# Patient Record
Sex: Female | Born: 1965 | Race: Black or African American | Hispanic: No | Marital: Married | Smoking: Never smoker
Health system: Southern US, Community
[De-identification: ages and names within clinical notes are randomized; demographics above are authoritative.]

## PROBLEM LIST (undated history)

## (undated) DIAGNOSIS — I10 Essential (primary) hypertension: Secondary | ICD-10-CM

## (undated) DIAGNOSIS — E119 Type 2 diabetes mellitus without complications: Secondary | ICD-10-CM

---

## 2009-01-11 ENCOUNTER — Ambulatory Visit: Payer: Self-pay | Admitting: Radiology

## 2009-01-11 ENCOUNTER — Emergency Department (HOSPITAL_BASED_OUTPATIENT_CLINIC_OR_DEPARTMENT_OTHER): Admission: EM | Admit: 2009-01-11 | Discharge: 2009-01-11 | Payer: Self-pay | Admitting: Emergency Medicine

## 2010-02-28 ENCOUNTER — Inpatient Hospital Stay (HOSPITAL_COMMUNITY): Admission: AD | Admit: 2010-02-28 | Discharge: 2010-02-28 | Payer: Self-pay | Admitting: Obstetrics & Gynecology

## 2010-02-28 ENCOUNTER — Ambulatory Visit: Payer: Self-pay | Admitting: Gynecology

## 2010-03-02 ENCOUNTER — Ambulatory Visit (HOSPITAL_COMMUNITY): Admission: RE | Admit: 2010-03-02 | Discharge: 2010-03-02 | Payer: Self-pay | Admitting: Obstetrics & Gynecology

## 2010-03-02 ENCOUNTER — Ambulatory Visit: Payer: Self-pay | Admitting: Obstetrics and Gynecology

## 2010-03-12 ENCOUNTER — Ambulatory Visit (HOSPITAL_COMMUNITY): Admission: RE | Admit: 2010-03-12 | Discharge: 2010-03-12 | Payer: Self-pay | Admitting: Obstetrics & Gynecology

## 2010-03-12 ENCOUNTER — Ambulatory Visit: Payer: Self-pay | Admitting: Obstetrics & Gynecology

## 2010-04-15 ENCOUNTER — Other Ambulatory Visit: Admission: RE | Admit: 2010-04-15 | Discharge: 2010-04-15 | Payer: Self-pay | Admitting: Obstetrics & Gynecology

## 2010-04-15 ENCOUNTER — Ambulatory Visit: Payer: Self-pay | Admitting: Obstetrics & Gynecology

## 2010-04-16 ENCOUNTER — Encounter: Payer: Self-pay | Admitting: Obstetrics & Gynecology

## 2010-04-16 LAB — CONVERTED CEMR LAB

## 2010-12-09 LAB — URINE CULTURE: Colony Count: >=100000

## 2010-12-09 LAB — URINALYSIS, ROUTINE W REFLEX MICROSCOPIC
Bilirubin Urine: NEGATIVE
Glucose, UA: NEGATIVE mg/dL
Hgb urine dipstick: NEGATIVE
Ketones, ur: NEGATIVE mg/dL
Leukocytes, UA: NEGATIVE
Nitrite: POSITIVE — AB
Specific Gravity, Urine: >1.03 — ABNORMAL HIGH (ref 1.005–1.030)
pH: 6 (ref 5.0–8.0)

## 2010-12-09 LAB — POCT PREGNANCY, URINE: Preg Test, Ur: POSITIVE

## 2010-12-09 LAB — CBC
MCV: 90.4 fL (ref 78.0–100.0)
RBC: 3.52 MIL/uL — ABNORMAL LOW (ref 3.87–5.11)
WBC: 8.2 10*3/uL (ref 4.0–10.5)

## 2010-12-09 LAB — URINE MICROSCOPIC-ADD ON

## 2010-12-09 LAB — WET PREP, GENITAL
Trich, Wet Prep: NONE SEEN
Yeast Wet Prep HPF POC: NONE SEEN

## 2010-12-09 LAB — HCG, QUANTITATIVE, PREGNANCY
hCG, Beta Chain, Quant, S: 85928 m[IU]/mL — ABNORMAL HIGH (ref <5)
hCG, Beta Chain, Quant, S: 87647 m[IU]/mL — ABNORMAL HIGH (ref <5)

## 2011-01-01 LAB — CBC
HCT: 32.7 % — ABNORMAL LOW (ref 36.0–46.0)
MCHC: 33.4 g/dL (ref 30.0–36.0)
MCHC: 34.7 g/dL (ref 30.0–36.0)
MCV: 88.9 fL (ref 78.0–100.0)
Platelets: 307 10*3/uL (ref 150–400)
RDW: 13.5 % (ref 11.5–15.5)
RDW: 13.8 % (ref 11.5–15.5)

## 2011-01-01 LAB — DIFFERENTIAL
Basophils Absolute: 0 10*3/uL (ref 0.0–0.1)
Basophils Absolute: 0 10*3/uL (ref 0.0–0.1)
Basophils Relative: 0 % (ref 0–1)
Eosinophils Absolute: 0.1 10*3/uL (ref 0.0–0.7)
Eosinophils Absolute: 0.1 10*3/uL (ref 0.0–0.7)
Eosinophils Relative: 2 % (ref 0–5)
Lymphocytes Relative: 25 % (ref 12–46)
Lymphocytes Relative: 32 % (ref 12–46)
Lymphs Abs: 2.1 10*3/uL (ref 0.7–4.0)
Monocytes Absolute: 0.4 10*3/uL (ref 0.1–1.0)

## 2011-02-04 NOTE — Assessment & Plan Note (Signed)
NAMEADRIONA, Sherry Hines                ACCOUNT NO.:  [id number]   MEDICAL RECORD NO.:  [id number]          PATIENT TYPE:  POB   LOCATION:  [facility] at [facility]         FACILITY:  [facility]   PHYSICIAN:  Sherry Collins, MD     DATE OF BIRTH:  [birth year]   DATE OF SERVICE:  04/15/2010                                  CLINIC NOTE   REASON FOR VISIT:  Postoperative appointment, physical examination.   HISTORY OF PRESENT ILLNESS:  The patient is a 45 year old gravida 6,  para 4-0-2-4 status post dilation and evacuation for blighted ovum on  March 12, 2010 who is here for a postoperative visit and her physical  examination to Pathology.  After that procedure, it has come back  positive for chorionic villi and products of conception.  Since the  procedure, the patient says she has not had any menstrual period, but  she does say that over the last 2 days, she has had some vulvar  irritation.  She has not noted any abnormal discharge or any other  gynecologic concerns.  The patient has resumed sexual activity without  any problems apart from the vulvar irritation.  The patient does not  have a primary gynecologist.  Her last Pap smear and mammogram were both  in 2009 and were normal.   PAST OBSTETRIC AND GYNECOLOGIC HISTORY:  The patient is a gravida 6,  para 4-0-2-4.  She has had 4 vaginal deliveries and 2 miscarriages, the  last one being recently.  She is on no birth control at this point.  She  had menarche at age 89.  She has regular menstrual periods 28 days in  between periods and appears lasts for 6 days.  She does report having  moderate dysmenorrhea and no intermenstrual bleeding.  Her last  menstrual period was December 31, 2009, which was prior to her blighted  ovum.  The patient's last Pap smear was 2 years ago.  She does have a  history of abnormal Pap smear in 1994 and had normal repeat Pap.  She  denies any other gynecologic disorders or sexually transmitted  infections.   PAST MEDICAL  HISTORY:  None.   PAST SURGICAL HISTORY:  Dilation and evacuation.   MEDICATIONS:  None.   ALLERGIES:  No known drug allergies.   SOCIAL HISTORY:  The patient works as a Licensed conveyancer.  She lives with  her husband and fourth daughter.  She denies any alcohol, tobacco, or  illicit drug use.  She denies any past or current history of sexual or  physical abuse.   FAMILY HISTORY:  Only remarkable for an extensive history of diabetes  and high blood pressure.  Mother also has a history of deep venous  thrombosis.  She denies any family history of cancer of the breast,  ovaries, or uterus.   REVIEW OF SYSTEMS:  Remarkable for headache, lightheadedness, and  abdominal pain and vulvar irritation which causes pain with intercourse.   PHYSICAL EXAMINATION:  VITAL SIGNS:  Blood pressure 125/81, pulse rate  is 71, weight 251 pounds, height 5 feet and 6 inches.  GENERAL:  No apparent distress.  HEENT:  Normocephalic, atraumatic.  NECK:  Normal thyroid.  No masses.  BREASTS:  Symmetric in size, soft, pendulous.  No abnormal masses, skin  changes, nipple drainage, or lymphadenopathy noted.  LUNGS:  Clear to  auscultation bilaterally.  HEART:  Regular rate and rhythm.  ABDOMEN:  Soft obese, nontender.  No organomegaly.  EXTREMITIES:  No cyanosis, clubbing, or edema.  PELVIC:  Normal external female genitalia.  Pink well rugated vagina.  No erythema or lesions seen on the introitus or in the vagina, some thin  white discharge was noted and the sample was taken for wet prep.  She  has normal cervical contour which is parous and does have a prominent  ectropion.  Pap smear was obtained.  On bimanual exam, the patient has  small mobile uterus.  No adnexal masses palpated secondary to patient's  habitus.   ASSESSMENT AND PLAN:  The patient is a 46 year old gravida 6, para 4-0-2-  4 for who is here for postoperative visit and for physical examination.  The patient just has post procedure  amenorrhea.  She was told that this  is not unusual and that no evaluation is necessary, unless she goes 3  months after the procedure without any period at which point workup in  the form of labs or ultrasound may be needed.  The patient is interested  in going on birth control.  She was told that this could make her  amenorrhea continue, but she was okay with this.  She has been on Yasmin  in the past and wanted prescription for this which was given to her.  The patient will also be scheduled for mammogram at the end of this  visit.  We will also follow up on her Pap smear results.  The patient  does have a normal examination after her D and E.  She was told that if  she did want to conceive again that she would have to be on prenatal  vitamins and other preconceptual counseling was given to the patient.  She says that she is not trying any time soon and will remain on Yaz for  now.  The patient was told to call or come back in for any further  gynecologic symptoms.           ______________________________  Sherry Collins, MD     UA/MEDQ  D:  04/15/2010  T:  04/16/2010  Job:  [id number]

## 2012-01-19 ENCOUNTER — Other Ambulatory Visit: Payer: Self-pay | Admitting: Obstetrics and Gynecology

## 2012-01-19 ENCOUNTER — Other Ambulatory Visit (HOSPITAL_COMMUNITY)
Admission: RE | Admit: 2012-01-19 | Discharge: 2012-01-19 | Disposition: A | Discharge status: Home / Self Care | Payer: Managed Care, Other (non HMO) | Source: Ambulatory Visit | Attending: Obstetrics and Gynecology | Admitting: Obstetrics and Gynecology

## 2012-01-19 DIAGNOSIS — Z124 Encounter for screening for malignant neoplasm of cervix: Secondary | ICD-10-CM | POA: Insufficient documentation

## 2012-01-26 ENCOUNTER — Other Ambulatory Visit: Payer: Self-pay | Admitting: Obstetrics and Gynecology

## 2012-09-28 ENCOUNTER — Other Ambulatory Visit: Payer: Self-pay

## 2012-10-27 ENCOUNTER — Encounter (HOSPITAL_COMMUNITY): Payer: Self-pay

## 2012-10-27 ENCOUNTER — Ambulatory Visit (HOSPITAL_COMMUNITY)
Admission: RE | Admit: 2012-10-27 | Discharge: 2012-10-27 | Disposition: A | Discharge status: Home / Self Care | Payer: Managed Care, Other (non HMO) | Source: Ambulatory Visit | Attending: Obstetrics and Gynecology | Admitting: Obstetrics and Gynecology

## 2012-10-27 ENCOUNTER — Other Ambulatory Visit: Payer: Self-pay | Admitting: Obstetrics and Gynecology

## 2012-10-27 VITALS — BP 136/76 | HR 60 | Wt 261.0 lb

## 2012-10-27 DIAGNOSIS — O289 Unspecified abnormal findings on antenatal screening of mother: Secondary | ICD-10-CM | POA: Insufficient documentation

## 2012-10-27 DIAGNOSIS — O358XX0 Maternal care for other (suspected) fetal abnormality and damage, not applicable or unspecified: Secondary | ICD-10-CM

## 2012-10-27 DIAGNOSIS — O09529 Supervision of elderly multigravida, unspecified trimester: Secondary | ICD-10-CM

## 2012-10-27 DIAGNOSIS — Z1389 Encounter for screening for other disorder: Secondary | ICD-10-CM | POA: Insufficient documentation

## 2012-10-27 DIAGNOSIS — O352XX0 Maternal care for (suspected) hereditary disease in fetus, not applicable or unspecified: Secondary | ICD-10-CM | POA: Insufficient documentation

## 2012-10-27 DIAGNOSIS — Z363 Encounter for antenatal screening for malformations: Secondary | ICD-10-CM | POA: Insufficient documentation

## 2012-10-27 NOTE — Progress Notes (Signed)
Sherry Hines  was seen today for an ultrasound appointment.  See full report in [facility].  Comments: [name] was seen today due to advanced maternal age and quad screen that quoted a 1:6 DSR as well as a 1:10 Trisomy 18 risk.  Additionally, she reports a previous pregnancy that was complicated by multiple anomalies and lethal fetal heart defect- formal karyotype was not performed with that gestation.  Ultrasound today was limited due to early gestational age.  There appears to be a midline cleft lip.  No other abnormalities or soft markers were identified, but limited views of the heart and face were obtained.  See separate note from Genetics counseling.  Findings and limitations of the ultrasound were discussed.  We briefly reviewed the etiology of cleft lip +/- palate to include isolated as well as syndromic/ genetic etiologies.  After counseling, the patient elected to undergo NIPT (cell free fetal DNA).  The patient will return in 2 weeks to reevaluate the fetal anatomy and to review test results.  Will schedule fetal echo at that time based on ultrasound finding at her next follow up.  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 Based on ultrasound findings at that time - will scheduled fetal echo at that time Alpha Gula, MD

## 2012-10-27 NOTE — Progress Notes (Signed)
Genetic Counseling  High-Risk Gestation Note  Appointment Date:  10/27/2012 Referred By: Fortino Sic, MD Date of Birth:  Apr 02, 1966 Partner:  Minerva Areola   Pregnancy History: [id number] Estimated Date of Delivery: 04/11/13 Estimated Gestational Age: [redacted]w[redacted]d Attending: Alpha Gula, MD  Mrs. Kern Alberta and her husband, Mr. Novelle Addair, were seen for genetic counseling regarding a maternal age of 47 y.o. and an abnormal First trimester screening result.    They were counseled regarding maternal age and the association with risk for chromosome conditions due to nondisjunction with aging of the ova.   We reviewed chromosomes, nondisjunction, and the associated 1 in 16 risk for fetal aneuploidy related to a maternal age of 47 y.o. at [redacted]w[redacted]d gestation.  They were counseled that the risk for aneuploidy decreases as gestational age increases, accounting for those pregnancies which spontaneously abort.  We specifically discussed Down syndrome (trisomy 48), trisomies 66 and 62, and sex chromosome aneuploidies (47,XXX and 47,XXY) including the common features and prognoses of each.   We also reviewed Ms. Vajda's First Trimester screening result and the associated increase in risks for fetal Down syndrome (1 in 26 to 1 in 6) and fetal trisomy 18 (1 in 50 to 1 in 10).  They were counseled regarding other explanations for a screen positive result including normal variation and differences in maternal metabolism. They understand that First trimester screening provides a pregnancy specific risk for aneuploidy, but is not considered to be diagnostic.    We reviewed other available screening options including noninvasive prenatal testing (NIPT) and detailed ultrasound.  Specifically, we discussed that NIPT analyzes cell free fetal DNA found in the maternal circulation. This test is not diagnostic for chromosome conditions, but can provide information regarding the presence or absence of extra fetal DNA for chromosomes 13,  18, 21, X, and Y, and missing fetal DNA for chromosome X (Turner syndrome) and Y. Thus, it would not identify or rule out all genetic conditions. The reported detection rate is greater than 99% for Trisomy 21, greater than 98% for Trisomy 18, and is approximately 80% (8 out of 10) for Trisomy 13. The false positive rate is reported to be less than 0.1% for any of these conditions.  In addition, we discussed that ~50-80% of fetuses with Down syndrome and up to 90-95% of fetuses with trisomy 18/13, when well visualized, have detectable anomalies or soft markers by detailed ultrasound (~18+ weeks gestation).   This couple was also counseled regarding diagnostic testing via amniocentesis.  We reviewed the approximate 1 in 300-500 risk for complications, including spontaneous pregnancy loss. After consideration of all the options, they expressed interest in having a detailed ultrasound.  A complete ultrasound was performed today.  The ultrasound report will be documented separately. Ultrasound views were limited today, given the early gestational age.  However, some views were concerning for a fetal cleft lip.  Follow up ultrasound was scheduled in ~2 weeks.  They were counseled that the presence of a fetal cleft lip increases the likelihood of a fetal aneuploidy.  We again reviewed the options of NIPT versus amniocentesis.  They elected to have NIPT ([facility]) today. Results should be available in ~8-10 business days.  Further counseling is warranted pending follow up ultrasound and review of the NIPT results.  Mrs. Cockrum was provided with written information regarding sickle cell anemia (SCA) including the carrier frequency and incidence in the African-American population, the availability of carrier testing and prenatal diagnosis if indicated.  In addition, we  discussed that hemoglobinopathies are routinely screened for as part of the [place] newborn screening panel.  We reviewed Mrs. Pennebaker's normal hemoglobin  electrophoresis results.  Both family histories were reviewed and found to be contributory.  This couple reported that they had a prior pregnancy with multiple anomalies.  They report that the screening for aneuploidy during this pregnancy was positive for trisomy 18.  Confirmatory testing via amniocentesis was not performed.  This couple elected to end the pregnancy based on the severity of the observed anomalies.  We discussed that if the fetus had trisomy 89, or another trisomy, the risk of recurrence for the current and future pregnancies would be increased above the age related risk.     In addition, Mr. Mian reported a very strong family history of cancer.  Unfortunately, he was unable to recall specific ages of diagnoses and types of cancer for his relatives.  We reviewed that although most cancers are thought to be sporadic or due to environmental factors, some families appear to have a strong predisposition to cancers.  When considering a family history of cancer, we look for common types of cancer in multiple family members occurring at younger than typical ages.  We discussed the option of meeting with a cancer genetic counselor to discuss any possible screening or testing options available. If they are concerned about the family history of cancer and would like to learn more about the family's chance for an inherited cancer syndrome, his doctor may refer him or his relatives to [facility] [phone]). The remainder of the family history was noncontributory for birth defects, mental retardation, and known genetic conditions. Without further information regarding the provided family history, an accurate genetic risk cannot be calculated. Further genetic counseling is warranted if more information is obtained.  Mrs. Hemsley denied exposure to environmental toxins or chemical agents. She denied the use of alcohol, tobacco or street drugs. She denied significant viral illnesses  during the course of her pregnancy.     I counseled this couple regarding the above risks and available options.  The approximate face-to-face time with the genetic counselor was 68 minutes.    Donald Prose, MS  Certified Genetic Counselor

## 2012-11-10 ENCOUNTER — Telehealth (HOSPITAL_COMMUNITY): Payer: Self-pay

## 2012-11-10 ENCOUNTER — Encounter (HOSPITAL_COMMUNITY): Payer: Self-pay

## 2012-11-10 ENCOUNTER — Ambulatory Visit (HOSPITAL_COMMUNITY)
Admission: RE | Admit: 2012-11-10 | Discharge: 2012-11-10 | Disposition: A | Discharge status: Home / Self Care | Payer: Managed Care, Other (non HMO) | Source: Ambulatory Visit | Attending: Obstetrics and Gynecology | Admitting: Obstetrics and Gynecology

## 2012-11-10 VITALS — BP 130/68 | HR 79 | Wt 264.5 lb

## 2012-11-10 DIAGNOSIS — O09529 Supervision of elderly multigravida, unspecified trimester: Secondary | ICD-10-CM | POA: Insufficient documentation

## 2012-11-10 DIAGNOSIS — Z3689 Encounter for other specified antenatal screening: Secondary | ICD-10-CM | POA: Insufficient documentation

## 2012-11-10 DIAGNOSIS — O289 Unspecified abnormal findings on antenatal screening of mother: Secondary | ICD-10-CM | POA: Insufficient documentation

## 2012-11-10 DIAGNOSIS — O358XX0 Maternal care for other (suspected) fetal abnormality and damage, not applicable or unspecified: Secondary | ICD-10-CM | POA: Insufficient documentation

## 2012-11-10 DIAGNOSIS — O352XX0 Maternal care for (suspected) hereditary disease in fetus, not applicable or unspecified: Secondary | ICD-10-CM | POA: Insufficient documentation

## 2012-11-10 NOTE — Telephone Encounter (Signed)
Called Kern Alberta to discuss her Harmony, cell free fetal DNA testing. Testing was offered because of a maternal age of 44 and an abnormal first trimester screening result.  Patient was identified by her name and DOB. We reviewed that these are within normal limits, showing a less than 1 in 10,000 risk for trisomies 21, 18 and 13. We reviewed that this testing identifies > 99% of pregnancies with trisomy 21, >98% of pregnancies with trisomy 8, and >80% with trisomy 4; the false positive rate is <0.1% for all conditions. Testing was also performed for X and Y chromosome analysis. This did not show evidence of aneuploidy for X or Y. It was also consistent with female gender. X and Y analysis has a detection rate of approximately 99%. She understands that this testing does not identify all genetic conditions. All questions were answered to her satisfaction, she was encouraged to call with additional questions or concerns.  Despina Arias, MS Certified Genetic Counselor

## 2012-11-10 NOTE — Progress Notes (Signed)
Sherry Hines  was seen today for an ultrasound appointment.  See full report in [facility].  Comments: Sherry Hines returns today for follow up.  She was initially seen due to advanced maternal age and quad screen that quoted a 1:6 DSR as well as a 1:10 Trisomy 18 risk.  Additionally, she reports a previous pregnancy that was complicated by multiple anomalies and lethal fetal heart defect- formal karyotype was not performed with that gestation.  NIPT (Cell free fetal DNA) returned low risk for fetal aneuploidy.  Somewhat limited views of Sherry fetal anatomy were obtained today due to fetal position and maternal body habitus.  There appears to be a right-sided cleft lip and palate.  On some views of Sherry profile, there appears to be extra tissue along Sherry lower portion of Sherry nose - but it is difficult to determine whether this is due to Sherry cleft lip on another facial abnormality.  Echogenic foci are noted in both ventricles.  Sherry fetal heart was suboptimally visualized, but there appears to be a Ventricular septal defect.  Sherry ultrasound findings and limitations were discussed with Sherry Hines.  Although NIPT is reassuring, Sherry facial defects that were appreciated may more closely resemble Trisomy 13 than Trisomy 18/21.  Sherry detection rate for Trisomy 13 by cell free fetal DNA is not as at Sherry level that is frequently quoted for Trisomy 21 and 18.  We discussed amniocentesis - will try to expedite a fetal echo with Peds cardiology. If Sherry fetal cardiac abnormalities are confirmed on fetal echo, feel that Sherry Hines will elect to undergo further testing (amniocentesis).   Impression: Single IUP at 18 2/7 weeks Right sided cleft lip/ palate ? facial abnormality on lower portion of Sherry nose Echogenic foci in both ventricles of Sherry heart.  Possible VSD although views somewhat limited. Normal amniotic fluid volume  Recommendations: Fetal echo tentatively scheduled tomorrow - although visualization may be  difficult at that time Hines will contact our office by Sherry end of Sherry week if Sherry Hines choosed to undergo amniocentesis Otherwise follow up ultrasound in 3-4 weeks for growth and to reevaluate fetal anatomy  Alpha Gula, MD

## 2012-11-18 ENCOUNTER — Telehealth (HOSPITAL_COMMUNITY): Payer: Self-pay

## 2012-11-18 NOTE — Telephone Encounter (Signed)
Called Mrs. Hoff today to check on her.  Echo last week showed a probable large VSD; the VSD coupled with a cleft lip and palate is concerning for an underlying genetic etiology.  Reviewed with her in clinic last week that the detection rate of t13 by Harmony is ~80%.  She was offered amniocentesis last week and declined. She understands that NIPT cannot detect all fetal aneuploidy/genetic conditions.  Left Mrs. Whitmoyer a message to return my call.   Donald Prose, MS Certified Genetic Counselor

## 2012-11-19 ENCOUNTER — Telehealth (HOSPITAL_COMMUNITY): Payer: Self-pay

## 2012-11-19 NOTE — Telephone Encounter (Signed)
Mrs. Kumari returned my call today.  She said they've spent the last week trying to decide how they wanted to proceed with the pregnancy.  They have elected to continue the pregnancy.  They understand that the fetus has multiple anomalies, including a cleft lip/probably cleft palate, and heart defect.  Considering her advanced maternal age (75), abnormal first screen, and abnormal u/s findings, the risk of fetal aneuploidy cannot be completely eliminated-even with a normal Harmony result.  We reviewed that Harmony detects approximately 80% of cases of trisomy 13 and that up to 20% of cases of trisomy 56 are missed with this technology.  In addition, we reviewed other genetic etiologies including chromosome aberrations (deletions, duplications, insertions, translocations) and single gene conditions.  Mrs. Durrett declined amniocentesis at this time.  We again reviewed the legal limit for TAB in [place] and surrounding states.  She has a follow up ultrasound in 2 weeks.  All questions answered.  She was encouraged to call me if she has any additional questions or concerns prior to her upcoming appointment.  Donald Prose, MS Certified Genetic Counselor

## 2012-12-08 ENCOUNTER — Encounter (HOSPITAL_COMMUNITY): Payer: Self-pay

## 2012-12-08 ENCOUNTER — Ambulatory Visit (HOSPITAL_COMMUNITY)
Admission: RE | Admit: 2012-12-08 | Discharge: 2012-12-08 | Disposition: A | Discharge status: Home / Self Care | Payer: Managed Care, Other (non HMO) | Source: Ambulatory Visit | Attending: Obstetrics and Gynecology | Admitting: Obstetrics and Gynecology

## 2012-12-08 VITALS — BP 121/59 | HR 66 | Wt 265.5 lb

## 2012-12-08 DIAGNOSIS — O289 Unspecified abnormal findings on antenatal screening of mother: Secondary | ICD-10-CM | POA: Insufficient documentation

## 2012-12-08 DIAGNOSIS — O358XX0 Maternal care for other (suspected) fetal abnormality and damage, not applicable or unspecified: Secondary | ICD-10-CM

## 2012-12-08 DIAGNOSIS — O09529 Supervision of elderly multigravida, unspecified trimester: Secondary | ICD-10-CM | POA: Insufficient documentation

## 2012-12-08 DIAGNOSIS — O352XX0 Maternal care for (suspected) hereditary disease in fetus, not applicable or unspecified: Secondary | ICD-10-CM | POA: Insufficient documentation

## 2012-12-08 NOTE — Progress Notes (Signed)
Sherry Hines  was seen today for an ultrasound appointment.  See full report in [facility].  Comments: Sherry Hines returns today for follow up.  She was initially seen due to advanced maternal age and quad screen that quoted a 1:6 DSR as well as a 1:10 Trisomy 18 risk.  Additionally, she reports a previous pregnancy that was complicated by multiple anomalies and lethal fetal heart defect- formal karyotype was not performed with that gestation.  NIPT (Cell free fetal DNA) returned low risk for fetal aneuploidy.  Sherry Hines hada fetal echo on 2/20 with findings suspecisous for an AV canal defect, moderal VSD and a possible single great vessel (could not rule out aortic or pulmonary atresia).  Ultrasound today reveals a right-sided cleft lip and palate.  Sherry nose appears abnormal (possible proboscus vs. artifact from Sherry cleft lip and palate).  Sherry cranial anatomy appears normal.  No other abnormalities are appreciated.  Sherry findings and limitations of Sherry study were discussed with Sherry Hines. Many of Sherry ultrasound findings are suspicious for Trisomy 13 despite Sherry results of cell free fetal DNA.  Sherry Hines is aware that Trisomy 37 is not compatible with survival beyond Sherry first year of life.  After our discussion, Sherry Hines is leaning toward amniocentesis and would like to have Sherry procedure done on Sherry same day as her follow up fetal echo which was tentatively scheduled for 26 March.  Sherry Hines realizes that her gestaional age to too advanced for legal termination in Sherry state of [place], but would consider this option in another state if needed.  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 proboscus vs. 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  Alpha Gula, MD

## 2012-12-15 ENCOUNTER — Ambulatory Visit (HOSPITAL_COMMUNITY): Payer: Managed Care, Other (non HMO)

## 2013-01-05 ENCOUNTER — Ambulatory Visit (HOSPITAL_COMMUNITY): Payer: Managed Care, Other (non HMO) | Attending: Obstetrics and Gynecology

## 2013-01-17 ENCOUNTER — Other Ambulatory Visit (HOSPITAL_COMMUNITY): Payer: Self-pay | Admitting: Maternal and Fetal Medicine

## 2013-01-17 DIAGNOSIS — O358XX0 Maternal care for other (suspected) fetal abnormality and damage, not applicable or unspecified: Secondary | ICD-10-CM

## 2013-01-17 DIAGNOSIS — O09529 Supervision of elderly multigravida, unspecified trimester: Secondary | ICD-10-CM

## 2013-01-18 ENCOUNTER — Other Ambulatory Visit (HOSPITAL_COMMUNITY): Payer: Self-pay | Admitting: Maternal and Fetal Medicine

## 2013-01-18 ENCOUNTER — Ambulatory Visit (HOSPITAL_COMMUNITY)
Admission: RE | Admit: 2013-01-18 | Discharge: 2013-01-18 | Disposition: A | Discharge status: Home / Self Care | Payer: Managed Care, Other (non HMO) | Source: Ambulatory Visit | Attending: Obstetrics and Gynecology | Admitting: Obstetrics and Gynecology

## 2013-01-18 DIAGNOSIS — O358XX0 Maternal care for other (suspected) fetal abnormality and damage, not applicable or unspecified: Secondary | ICD-10-CM | POA: Insufficient documentation

## 2013-01-18 DIAGNOSIS — O09529 Supervision of elderly multigravida, unspecified trimester: Secondary | ICD-10-CM

## 2013-01-18 DIAGNOSIS — O289 Unspecified abnormal findings on antenatal screening of mother: Secondary | ICD-10-CM | POA: Insufficient documentation

## 2013-01-18 DIAGNOSIS — O352XX0 Maternal care for (suspected) hereditary disease in fetus, not applicable or unspecified: Secondary | ICD-10-CM | POA: Insufficient documentation

## 2013-02-11 ENCOUNTER — Telehealth: Payer: Self-pay | Admitting: Obstetrics and Gynecology

## 2013-02-11 NOTE — Telephone Encounter (Signed)
MFM  Spoke with patient. Discussed that given she had a fetal demise over 3 weeks ago it is very important and urgent that she undergo induction. There is risk of bleeding, infection, and risk of hysterectomy. Patient reports some minor vaginal bleeding.  Patient has agreed to undergo induction at [facility] tomorrow at 3pm- she is to arrive at 2:30pm.  Discussed if she has heavy bleeding, contractions, or fever prior to then she should go straight to the hospital.  Discussed with Drs. Rachel Bo and Camiollo  Eulis Foster, MD

## 2013-09-01 ENCOUNTER — Encounter (HOSPITAL_COMMUNITY): Payer: Self-pay | Admitting: *Deleted

## 2014-02-26 ENCOUNTER — Emergency Department (HOSPITAL_BASED_OUTPATIENT_CLINIC_OR_DEPARTMENT_OTHER): Payer: Managed Care, Other (non HMO)

## 2014-02-26 ENCOUNTER — Emergency Department (HOSPITAL_BASED_OUTPATIENT_CLINIC_OR_DEPARTMENT_OTHER)
Admission: EM | Admit: 2014-02-26 | Discharge: 2014-02-26 | Disposition: A | Discharge status: Home / Self Care | Payer: Managed Care, Other (non HMO) | Attending: Emergency Medicine | Admitting: Emergency Medicine

## 2014-02-26 ENCOUNTER — Encounter (HOSPITAL_BASED_OUTPATIENT_CLINIC_OR_DEPARTMENT_OTHER): Payer: Self-pay | Admitting: Emergency Medicine

## 2014-02-26 DIAGNOSIS — W010XXA Fall on same level from slipping, tripping and stumbling without subsequent striking against object, initial encounter: Secondary | ICD-10-CM | POA: Insufficient documentation

## 2014-02-26 DIAGNOSIS — I10 Essential (primary) hypertension: Secondary | ICD-10-CM | POA: Insufficient documentation

## 2014-02-26 DIAGNOSIS — Y9389 Activity, other specified: Secondary | ICD-10-CM | POA: Insufficient documentation

## 2014-02-26 DIAGNOSIS — Y9229 Other specified public building as the place of occurrence of the external cause: Secondary | ICD-10-CM | POA: Insufficient documentation

## 2014-02-26 DIAGNOSIS — S5290XA Unspecified fracture of unspecified forearm, initial encounter for closed fracture: Secondary | ICD-10-CM

## 2014-02-26 HISTORY — DX: Essential (primary) hypertension: I10

## 2014-02-26 MED ORDER — HYDROCODONE-ACETAMINOPHEN 5-325 MG PO TABS
2.0000 | ORAL_TABLET | ORAL | Status: AC | PRN
Start: 2014-02-26 — End: ?

## 2014-02-26 MED ORDER — HYDROCODONE-ACETAMINOPHEN 5-325 MG PO TABS
2.0000 | ORAL_TABLET | Freq: Once | ORAL | Status: AC
  Administered 2014-02-26: 2 via ORAL
  Filled 2014-02-26: qty 2

## 2014-02-26 NOTE — ED Provider Notes (Signed)
Medical screening examination/treatment/procedure(s) were performed by non-physician practitioner and as supervising physician I was immediately available for consultation/collaboration.   EKG Interpretation None       Gay Moncivais, MD 02/26/14 1614 

## 2014-02-26 NOTE — Discharge Instructions (Signed)
Wrist Fracture A wrist fracture is a break in one of the bones of the wrist. Your wrist is made up of several small bones at the palm of your hand (carpal bones) and the two bones that make up your forearm (radius and ulna). The bones come together to form multiple large and small joints. The shape and design of these joints allow your wrist to bend and straighten, move side-to-side, and rotate, as in twisting your palm up or down. CAUSES  A fracture may occur in any of the bones in your wrist when enough force is applied to the wrist, such as when falling down onto an outstretched hand. Severe injuries may occur from a more forceful injury. SYMPTOMS Symptoms of wrist fractures include tenderness, bruising, and swelling. Additionally, the wrist may hang in an odd position or may be misshaped. DIAGNOSIS To diagnose a wrist fracture, your caregiver will physically examine your wrist. Your caregiver may also request an X-ray exam of your wrist. TREATMENT Treatment depends on many factors, including the nature and location of the fracture, your age, and your activity level. Treatment for wrist fracture can be nonsurgical or surgical. For nonsurgical treatment, a plaster cast or splint may be applied to your wrist if the bone is in a good position (aligned). The cast will stay on for about 6 weeks. If the alignment of your bone is not good, it may be necessary to realign (reduce) it. After the bone is reduced, a splint usually is placed on your wrist to allow for a small amount of normal swelling. After about 1 week, the splint is removed and a cast is added. The cast is removed 2 or 3 weeks later, after the swelling goes down, causing the cast to loosen. Another cast is applied. This cast is removed after about another 2 or 3 weeks, for a total of 4 to 6 weeks of immobilization. Sometimes the position of the bone is so far out of place that surgery is required to apply a device to hold it together as it  heals. If the bone cannot be reduced without cutting the skin around the bone (closed reduction), a cut (incision) is made to allow direct access to the bone to reduce it (open reduction). Depending on the fracture, there are a number of options for holding the bone in place while it heals, including a cast, metal pins, a plate and screws, and a device called an external fixator. With an external fixator, most of the hardware remains outside of the body. HOME CARE INSTRUCTIONS  To lessen swelling, keep your injured wrist elevated and move your fingers as much as possible.  Apply ice to your wrist for the first 1 to 2 days after you have been treated or as directed by your caregiver. Applying ice helps to reduce inflammation and pain.  Put ice in a plastic bag.  Place a towel between your skin and the bag.  Leave the ice on for 15 to 20 minutes at a time, every 2 hours while you are awake.  Do not put pressure on any part of your cast or splint. It may break.  Use a plastic bag to protect your cast or splint from water while bathing or showering. Do not lower your cast or splint into water.  Only take over-the-counter or prescription medicines for pain as directed by your caregiver. SEEK IMMEDIATE MEDICAL CARE IF:   Your cast or splint gets damaged or breaks.  You have continued severe pain   or more swelling than you did before the cast was put on.  Your skin or fingernails below the injury turn blue or gray or feel cold or numb.  You develop decreased feeling in your fingers. MAKE SURE YOU:  Understand these instructions.  Will watch your condition.  Will get help right away if you are not doing well or get worse. Document Released: 06/18/2005 Document Revised: 12/01/2011 Document Reviewed: 09/26/2011 [employer] Patient Information 2014 [employer].  

## 2014-02-26 NOTE — ED Provider Notes (Signed)
CSN: [id number]     Arrival date & time 02/26/14  1209 History   First MD Initiated Contact with Patient 02/26/14 1308     Chief Complaint  Patient presents with  . Wrist Pain     (Consider location/radiation/quality/duration/timing/severity/associated sxs/prior Treatment) Patient is a 48 y.o. female presenting with wrist pain. The history is provided by the patient. No language interpreter was used.  Wrist Pain This is a new problem. The current episode started today. The problem occurs constantly. The problem has been unchanged. Associated symptoms include myalgias. Nothing aggravates the symptoms. She has tried nothing for the symptoms. The treatment provided moderate relief.    Past Medical History  Diagnosis Date  . Hypertension    History reviewed. No pertinent past surgical history. No family history on file. History  Substance Use Topics  . Smoking status: Never Smoker   . Smokeless tobacco: Not on file  . Alcohol Use: Not on file   OB History   Grav Para Term Preterm Abortions TAB SAB Ect Mult Living   8 4 4  0 3 1 2  0 0 4     Review of Systems  Musculoskeletal: Positive for myalgias.  All other systems reviewed and are negative.     Allergies  Review of patient's allergies indicates no known allergies.  Home Medications   Prior to Admission medications   Not on File   BP 146/79  Pulse 66  Temp(Src) 98.2 F (36.8 C)  Resp 18  Wt 257 lb (116.574 kg)  SpO2 98%  LMP 02/01/2014 Physical Exam  Nursing note and vitals reviewed. Constitutional: She is oriented to person, place, and time. She appears well-developed and well-nourished.  HENT:  Head: Normocephalic and atraumatic.  Cardiovascular: Normal rate.   Pulmonary/Chest: Effort normal.  Musculoskeletal: She exhibits tenderness.  Neurological: She is alert and oriented to person, place, and time. She has normal reflexes.  Skin: Skin is warm.  Psychiatric: She has a normal mood and affect.    ED  Course  Procedures (including critical care time) Labs Review Labs Reviewed - No data to display  Imaging Review Dg Wrist Complete Left  02/26/2014   CLINICAL DATA:  Fall, medial and lateral wrist pain  EXAM: LEFT WRIST - COMPLETE 3+ VIEW  COMPARISON:  None  FINDINGS: Comminuted and slightly impacted distal radius fracture with intra-articular extension. Minimally displaced fracture through the ulnar styloid. The carpus appears congruent. The visualized bones and joints the hand are unremarkable. The distal radioulnar joint remains congruent. There is bowing of the pronator quadratus fat pad consistent with very fracture hematoma.  IMPRESSION: 1. Comminuted and impacted distal radius fracture with intra-articular extension. 2. Minimally displaced ulnar styloid fracture.   Electronically Signed   By: Malachy Moan M.D.   On: 02/26/2014 13:20     EKG Interpretation None      MDM   Final diagnoses:  Radius fracture    Pt placed in sugar tong Rx for Hydrocodone Call Dr. Carollee Massed office to be seen for evaluation    Elson Areas, PA-C 02/26/14 1348

## 2014-02-26 NOTE — ED Notes (Signed)
PA at bedside.

## 2014-02-26 NOTE — ED Notes (Signed)
PA requested sugar tong splint

## 2014-02-26 NOTE — ED Notes (Signed)
Patient here with left wrist pain after [place] and falling in [place], pain radiating from wrist to elbow, swelling noted, no obvious deformity

## 2014-07-24 ENCOUNTER — Encounter (HOSPITAL_BASED_OUTPATIENT_CLINIC_OR_DEPARTMENT_OTHER): Payer: Self-pay | Admitting: Emergency Medicine

## 2015-09-10 ENCOUNTER — Emergency Department (HOSPITAL_BASED_OUTPATIENT_CLINIC_OR_DEPARTMENT_OTHER)
Admission: EM | Admit: 2015-09-10 | Discharge: 2015-09-11 | Disposition: A | Discharge status: Home / Self Care | Payer: BLUE CROSS/BLUE SHIELD | Attending: Emergency Medicine | Admitting: Emergency Medicine

## 2015-09-10 ENCOUNTER — Encounter (HOSPITAL_BASED_OUTPATIENT_CLINIC_OR_DEPARTMENT_OTHER): Payer: Self-pay | Admitting: *Deleted

## 2015-09-10 DIAGNOSIS — Y998 Other external cause status: Secondary | ICD-10-CM | POA: Diagnosis not present

## 2015-09-10 DIAGNOSIS — I10 Essential (primary) hypertension: Secondary | ICD-10-CM | POA: Insufficient documentation

## 2015-09-10 DIAGNOSIS — M25462 Effusion, left knee: Secondary | ICD-10-CM | POA: Insufficient documentation

## 2015-09-10 DIAGNOSIS — S8002XA Contusion of left knee, initial encounter: Secondary | ICD-10-CM | POA: Diagnosis not present

## 2015-09-10 DIAGNOSIS — M1990 Unspecified osteoarthritis, unspecified site: Secondary | ICD-10-CM | POA: Diagnosis not present

## 2015-09-10 DIAGNOSIS — W108XXA Fall (on) (from) other stairs and steps, initial encounter: Secondary | ICD-10-CM | POA: Insufficient documentation

## 2015-09-10 DIAGNOSIS — Y9289 Other specified places as the place of occurrence of the external cause: Secondary | ICD-10-CM | POA: Diagnosis not present

## 2015-09-10 DIAGNOSIS — S8992XA Unspecified injury of left lower leg, initial encounter: Secondary | ICD-10-CM | POA: Diagnosis present

## 2015-09-10 DIAGNOSIS — Y9389 Activity, other specified: Secondary | ICD-10-CM | POA: Insufficient documentation

## 2015-09-10 DIAGNOSIS — T148XXA Other injury of unspecified body region, initial encounter: Secondary | ICD-10-CM

## 2015-09-10 NOTE — ED Notes (Signed)
Fell down 17 carpet covered steps. She was wearing new boots that were slick and slipped on the carpet. This happened 10 days ago. She has been taking Naproxen with no relief. Bruising to her knee. She had a negative xray at [facility].
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
## 2015-09-11 ENCOUNTER — Encounter (HOSPITAL_BASED_OUTPATIENT_CLINIC_OR_DEPARTMENT_OTHER): Payer: Self-pay | Admitting: Emergency Medicine

## 2015-09-11 ENCOUNTER — Emergency Department (HOSPITAL_BASED_OUTPATIENT_CLINIC_OR_DEPARTMENT_OTHER): Payer: BLUE CROSS/BLUE SHIELD

## 2015-09-11 MED ORDER — DICLOFENAC SODIUM ER 100 MG PO TB24: 100.0000 mg | ORAL_TABLET | Freq: Every day | ORAL | Status: AC

## 2015-09-11 MED ORDER — KETOROLAC TROMETHAMINE 60 MG/2ML IM SOLN
60.0000 mg | Freq: Once | INTRAMUSCULAR | Status: AC
  Administered 2015-09-11: 60 mg via INTRAMUSCULAR
  Filled 2015-09-11: qty 2
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
## 2019-01-20 ENCOUNTER — Other Ambulatory Visit: Payer: Self-pay

## 2019-01-20 ENCOUNTER — Encounter (HOSPITAL_BASED_OUTPATIENT_CLINIC_OR_DEPARTMENT_OTHER): Payer: Self-pay | Admitting: *Deleted

## 2019-01-20 ENCOUNTER — Emergency Department (HOSPITAL_BASED_OUTPATIENT_CLINIC_OR_DEPARTMENT_OTHER): Payer: BLUE CROSS/BLUE SHIELD

## 2019-01-20 ENCOUNTER — Emergency Department (HOSPITAL_BASED_OUTPATIENT_CLINIC_OR_DEPARTMENT_OTHER)
Admission: EM | Admit: 2019-01-20 | Discharge: 2019-01-20 | Disposition: A | Discharge status: Home / Self Care | Payer: BLUE CROSS/BLUE SHIELD | Attending: Emergency Medicine | Admitting: Emergency Medicine

## 2019-01-20 DIAGNOSIS — Y9389 Activity, other specified: Secondary | ICD-10-CM | POA: Insufficient documentation

## 2019-01-20 DIAGNOSIS — Y998 Other external cause status: Secondary | ICD-10-CM | POA: Insufficient documentation

## 2019-01-20 DIAGNOSIS — Z23 Encounter for immunization: Secondary | ICD-10-CM | POA: Diagnosis not present

## 2019-01-20 DIAGNOSIS — M545 Low back pain, unspecified: Secondary | ICD-10-CM

## 2019-01-20 DIAGNOSIS — Y92410 Unspecified street and highway as the place of occurrence of the external cause: Secondary | ICD-10-CM | POA: Insufficient documentation

## 2019-01-20 DIAGNOSIS — I10 Essential (primary) hypertension: Secondary | ICD-10-CM | POA: Insufficient documentation

## 2019-01-20 DIAGNOSIS — Z79899 Other long term (current) drug therapy: Secondary | ICD-10-CM | POA: Diagnosis not present

## 2019-01-20 MED ORDER — TETANUS-DIPHTH-ACELL PERTUSSIS 5-2.5-18.5 LF-MCG/0.5 IM SUSP
0.5000 mL | Freq: Once | INTRAMUSCULAR | Status: AC
  Administered 2019-01-20: 0.5 mL via INTRAMUSCULAR
  Filled 2019-01-20: qty 0.5

## 2019-01-20 MED ORDER — HYDROCODONE-ACETAMINOPHEN 5-325 MG PO TABS
2.0000 | ORAL_TABLET | Freq: Once | ORAL | Status: AC
  Administered 2019-01-20: 17:00:00 2 via ORAL
  Filled 2019-01-20: qty 2

## 2019-01-20 MED ORDER — METHOCARBAMOL 500 MG PO TABS: 500.0000 mg | ORAL_TABLET | Freq: Two times a day (BID) | ORAL | 0 refills | Status: DC | PRN

## 2019-01-20 MED ORDER — BACITRACIN ZINC 500 UNIT/GM EX OINT: TOPICAL_OINTMENT | Freq: Once | CUTANEOUS | Status: DC
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
## 2019-01-20 NOTE — ED Notes (Signed)
Bacitracin dressing applied to left lower leg.
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
## 2019-02-19 ENCOUNTER — Encounter (HOSPITAL_BASED_OUTPATIENT_CLINIC_OR_DEPARTMENT_OTHER): Payer: Self-pay | Admitting: *Deleted

## 2019-02-19 ENCOUNTER — Other Ambulatory Visit: Payer: Self-pay

## 2019-02-19 ENCOUNTER — Emergency Department (HOSPITAL_BASED_OUTPATIENT_CLINIC_OR_DEPARTMENT_OTHER)
Admission: EM | Admit: 2019-02-19 | Discharge: 2019-02-19 | Disposition: A | Discharge status: Home / Self Care | Payer: BLUE CROSS/BLUE SHIELD | Attending: Emergency Medicine | Admitting: Emergency Medicine

## 2019-02-19 ENCOUNTER — Emergency Department (HOSPITAL_BASED_OUTPATIENT_CLINIC_OR_DEPARTMENT_OTHER): Payer: BLUE CROSS/BLUE SHIELD

## 2019-02-19 DIAGNOSIS — Y9389 Activity, other specified: Secondary | ICD-10-CM | POA: Insufficient documentation

## 2019-02-19 DIAGNOSIS — I10 Essential (primary) hypertension: Secondary | ICD-10-CM | POA: Insufficient documentation

## 2019-02-19 DIAGNOSIS — R071 Chest pain on breathing: Secondary | ICD-10-CM | POA: Insufficient documentation

## 2019-02-19 DIAGNOSIS — R0602 Shortness of breath: Secondary | ICD-10-CM | POA: Diagnosis not present

## 2019-02-19 DIAGNOSIS — Y998 Other external cause status: Secondary | ICD-10-CM | POA: Diagnosis not present

## 2019-02-19 DIAGNOSIS — Y92410 Unspecified street and highway as the place of occurrence of the external cause: Secondary | ICD-10-CM | POA: Diagnosis not present

## 2019-02-19 DIAGNOSIS — R0789 Other chest pain: Secondary | ICD-10-CM | POA: Insufficient documentation

## 2019-02-19 MED ORDER — METHOCARBAMOL 500 MG PO TABS: 500.0000 mg | ORAL_TABLET | Freq: Three times a day (TID) | ORAL | 0 refills | Status: DC | PRN

## 2019-02-19 MED ORDER — OXYCODONE-ACETAMINOPHEN 5-325 MG PO TABS
1.0000 | ORAL_TABLET | Freq: Once | ORAL | Status: AC
  Administered 2019-02-19: 1 via ORAL
  Filled 2019-02-19: qty 1

## 2019-02-19 MED ORDER — NAPROXEN 500 MG PO TABS: 500.0000 mg | ORAL_TABLET | Freq: Two times a day (BID) | ORAL | 0 refills | Status: AC
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
## 2019-12-18 ENCOUNTER — Emergency Department (HOSPITAL_BASED_OUTPATIENT_CLINIC_OR_DEPARTMENT_OTHER): Payer: BC Managed Care – PPO

## 2019-12-18 ENCOUNTER — Emergency Department (HOSPITAL_BASED_OUTPATIENT_CLINIC_OR_DEPARTMENT_OTHER)
Admission: EM | Admit: 2019-12-18 | Discharge: 2019-12-18 | Disposition: A | Discharge status: Home / Self Care | Payer: BC Managed Care – PPO | Attending: Emergency Medicine | Admitting: Emergency Medicine

## 2019-12-18 ENCOUNTER — Other Ambulatory Visit: Payer: Self-pay

## 2019-12-18 ENCOUNTER — Encounter (HOSPITAL_BASED_OUTPATIENT_CLINIC_OR_DEPARTMENT_OTHER): Payer: Self-pay | Admitting: Emergency Medicine

## 2019-12-18 DIAGNOSIS — I10 Essential (primary) hypertension: Secondary | ICD-10-CM | POA: Insufficient documentation

## 2019-12-18 DIAGNOSIS — E119 Type 2 diabetes mellitus without complications: Secondary | ICD-10-CM | POA: Diagnosis not present

## 2019-12-18 DIAGNOSIS — M791 Myalgia, unspecified site: Secondary | ICD-10-CM | POA: Diagnosis not present

## 2019-12-18 DIAGNOSIS — R0789 Other chest pain: Secondary | ICD-10-CM | POA: Insufficient documentation

## 2019-12-18 DIAGNOSIS — Z79899 Other long term (current) drug therapy: Secondary | ICD-10-CM | POA: Insufficient documentation

## 2019-12-18 DIAGNOSIS — M7918 Myalgia, other site: Secondary | ICD-10-CM

## 2019-12-18 HISTORY — DX: Type 2 diabetes mellitus without complications: E11.9

## 2019-12-18 LAB — BASIC METABOLIC PANEL
Anion gap: 7 (ref 5–15)
BUN: 12 mg/dL (ref 6–20)
CO2: 26 mmol/L (ref 22–32)
Calcium: 8.8 mg/dL — ABNORMAL LOW (ref 8.9–10.3)
Chloride: 107 mmol/L (ref 98–111)
Creatinine, Ser: 0.86 mg/dL (ref 0.44–1.00)
GFR calc Af Amer: >60 mL/min (ref >60)
GFR calc non Af Amer: >60 mL/min (ref >60)
Glucose, Bld: 130 mg/dL — ABNORMAL HIGH (ref 70–99)
Potassium: 4.9 mmol/L (ref 3.5–5.1)
Sodium: 140 mmol/L (ref 135–145)

## 2019-12-18 LAB — TROPONIN I (HIGH SENSITIVITY)
Troponin I (High Sensitivity): 4 ng/L (ref <18)
Troponin I (High Sensitivity): 5 ng/L (ref <18)

## 2019-12-18 LAB — CBC
HCT: 36.2 % (ref 36.0–46.0)
Hemoglobin: 11.6 g/dL — ABNORMAL LOW (ref 12.0–15.0)
MCH: 29.3 pg (ref 26.0–34.0)
MCHC: 32 g/dL (ref 30.0–36.0)
MCV: 91.4 fL (ref 80.0–100.0)
Platelets: 284 10*3/uL (ref 150–400)
RBC: 3.96 MIL/uL (ref 3.87–5.11)
RDW: 14.6 % (ref 11.5–15.5)
WBC: 6.9 10*3/uL (ref 4.0–10.5)
nRBC: 0 % (ref 0.0–0.2)

## 2019-12-18 MED ORDER — CYCLOBENZAPRINE HCL 5 MG PO TABS
5.0000 mg | ORAL_TABLET | Freq: Once | ORAL | Status: AC
  Administered 2019-12-18: 10:00:00 5 mg via ORAL
  Filled 2019-12-18: qty 1

## 2019-12-18 MED ORDER — HYDROCODONE-ACETAMINOPHEN 5-325 MG PO TABS
1.0000 | ORAL_TABLET | Freq: Once | ORAL | Status: AC
  Administered 2019-12-18: 1 via ORAL
  Filled 2019-12-18: qty 1

## 2019-12-18 MED ORDER — PREDNISONE 50 MG PO TABS
60.0000 mg | ORAL_TABLET | Freq: Once | ORAL | Status: AC
  Administered 2019-12-18: 60 mg via ORAL
  Filled 2019-12-18: qty 1

## 2019-12-18 MED ORDER — KETOROLAC TROMETHAMINE 15 MG/ML IJ SOLN
15.0000 mg | Freq: Once | INTRAMUSCULAR | Status: AC
  Administered 2019-12-18: 15 mg via INTRAVENOUS
  Filled 2019-12-18: qty 1

## 2019-12-18 MED ORDER — PREDNISONE 10 MG PO TABS: 40.0000 mg | ORAL_TABLET | Freq: Every day | ORAL | 0 refills | Status: AC

## 2019-12-18 MED ORDER — CYCLOBENZAPRINE HCL 10 MG PO TABS: 10.0000 mg | ORAL_TABLET | Freq: Two times a day (BID) | ORAL | 0 refills | Status: AC | PRN

## 2019-12-18 NOTE — Discharge Instructions (Signed)
You are seen today for right-sided chest, arm and neck pain.  Your work-up was very reassuring including your chest x-ray, heart enzymes and EKG.  We feel that most of your pain is coming from a muscle.  We have prescribed you some medication for this.  You should still follow-up with your primary care doctor for further work-up and further treatment.  If you have any new or worsening symptoms please return to the emergency department for reevaluation.

## 2019-12-18 NOTE — ED Triage Notes (Signed)
Pt here with right sided chest pain radiating to neck since Thursday. Worse when taking a deep breaths.

## 2019-12-18 NOTE — ED Provider Notes (Signed)
[facility] EMERGENCY DEPARTMENT Provider Note   CSN: [id number] Arrival date & time: 12/18/19  0849     History Chief Complaint  Patient presents with  . Chest Pain    Sherry Hines is a 54 y.o. female.  Patient is a 54 year old obese female with past medical history of hypertension and diabetes presenting to the emergency department for right-sided chest pain.  Patient reports that Thursday after she got home from church she had gradually worsening right-sided chest, shoulder and neck pain.  The pain is worse with any kind of movement of her arm or torso and is better if she stays perfectly still.  She has some pain that radiates down towards her elbow.  Denies any injury or trauma.  Reports when she coughs or breathes deeply she also feels the pain.  She has not tried anything for relief.  She denies smoking cigarettes.        Past Medical History:  Diagnosis Date  . Diabetes mellitus without complication (HCC)   . Hypertension     There are no problems to display for this patient.   History reviewed. No pertinent surgical history.   OB History    Gravida  8   Para  4   Term  4   Preterm  0   AB  3   Living  4     SAB  2   TAB  1   Ectopic  0   Multiple  0   Live Births              History reviewed. No pertinent family history.  Social History   Tobacco Use  . Smoking status: Never Smoker  . Smokeless tobacco: Never Used  Substance Use Topics  . Alcohol use: No  . Drug use: No    Home Medications Prior to Admission medications   Medication Sig Start Date End Date Taking? Authorizing Provider  cyclobenzaprine (FLEXERIL) 10 MG tablet Take 1 tablet (10 mg total) by mouth 2 (two) times daily as needed for up to 7 days for muscle spasms. 12/18/19 12/25/19  Arlyn Dunning, PA-C  Diclofenac Sodium CR (VOLTAREN-XR) 100 MG 24 hr tablet Take 1 tablet (100 mg total) by mouth daily. 09/11/15   Palumbo, April, MD    HYDROcodone-acetaminophen (NORCO/VICODIN) 5-325 MG per tablet Take 2 tablets by mouth every 4 (four) hours as needed. 02/26/14   Elson Areas, PA-C  methocarbamol (ROBAXIN) 500 MG tablet Take 1 tablet (500 mg total) by mouth every 8 (eight) hours as needed. 02/19/19   Petrucelli, Samantha R, PA-C  naproxen (NAPROSYN) 500 MG tablet Take 1 tablet (500 mg total) by mouth 2 (two) times daily. 02/19/19   Petrucelli, Samantha R, PA-C  predniSONE (DELTASONE) 10 MG tablet Take 4 tablets (40 mg total) by mouth daily for 5 days. 12/18/19 12/23/19  Ronnie Doss A, PA-C  TRAMADOL HCL PO Take by mouth.    [provider]    Allergies    Patient has no known allergies.  Review of Systems   Review of Systems  Constitutional: Negative for chills and fever.  HENT: Negative for ear pain and sore throat.   Eyes: Negative for pain and visual disturbance.  Respiratory: Negative for cough and shortness of breath.   Cardiovascular: Positive for chest pain. Negative for palpitations.  Gastrointestinal: Negative for abdominal pain and vomiting.  Genitourinary: Negative for dysuria and hematuria.  Musculoskeletal: Positive for arthralgias and neck pain. Negative for  back pain.  Skin: Negative for color change and rash.  Neurological: Negative for seizures and syncope.  All other systems reviewed and are negative.   Physical Exam Updated Vital Signs BP 105/66   Pulse (!) 51   Temp 98.3 F (36.8 C)   Resp 13   SpO2 99% Comment: rm air  Physical Exam Vitals and nursing note reviewed.  Constitutional:      General: She is not in acute distress.    Appearance: Normal appearance. She is well-developed. She is obese. She is not ill-appearing, toxic-appearing or diaphoretic.  HENT:     Head: Normocephalic.  Eyes:     Conjunctiva/sclera: Conjunctivae normal.  Cardiovascular:     Rate and Rhythm: Normal rate and regular rhythm.  Pulmonary:     Effort: Pulmonary effort is normal.     Breath sounds:  Normal breath sounds.  Chest:     Chest wall: Tenderness present.  Musculoskeletal:     Right lower leg: No edema.     Left lower leg: No edema.     Comments: Patient has diffuse muscular tenderness to palpation of the anterior right chest, right shoulder and right trapezius.  Patient is sitting in a stiff position and resist any kind of movement of the right arm stating that this causes her pain.  Her grip strength is normal, sensation is normal, distal pulses are normal  Skin:    General: Skin is warm and dry.     Findings: No rash.  Neurological:     Mental Status: She is alert.  Psychiatric:        Mood and Affect: Mood normal.     ED Results / Procedures / Treatments   Labs (all labs ordered are listed, but only abnormal results are displayed) Labs Reviewed  BASIC METABOLIC PANEL - Abnormal; Notable for the following components:      Result Value   Glucose, Bld 130 (*)    Calcium 8.8 (*)    All other components within normal limits  CBC - Abnormal; Notable for the following components:   Hemoglobin 11.6 (*)    All other components within normal limits  TROPONIN I (HIGH SENSITIVITY)  TROPONIN I (HIGH SENSITIVITY)    EKG None  Radiology DG Chest Port 1 View  Result Date: 12/18/2019 CLINICAL DATA:  Chest pain EXAM: PORTABLE CHEST 1 VIEW COMPARISON:  Chest radiograph dated 02/19/2019 FINDINGS: The heart size and mediastinal contours are within normal limits accounting for technique. The lung volumes are low. The left lower lung is obscured which may reflect atelectasis/airspace disease versus overlying cardiac silhouette. The right lung is clear. There is no pleural effusion or pneumothorax. The visualized skeletal structures are unremarkable. IMPRESSION: Possible left lower lung atelectasis/airspace disease. Electronically Signed   By: Zerita Boers M.D.   On: 12/18/2019 10:05    Procedures Procedures (including critical care time)  Medications Ordered in  ED Medications  predniSONE (DELTASONE) tablet 60 mg (60 mg Oral Given 12/18/19 0953)  ketorolac (TORADOL) 15 MG/ML injection 15 mg (15 mg Intravenous Given 12/18/19 0954)  cyclobenzaprine (FLEXERIL) tablet 5 mg (5 mg Oral Given 12/18/19 0954)  HYDROcodone-acetaminophen (NORCO/VICODIN) 5-325 MG per tablet 1 tablet (1 tablet Oral Given 12/18/19 1054)    ED Course  I have reviewed the triage vital signs and the nursing notes.  Pertinent labs & imaging results that were available during my care of the patient were reviewed by me and considered in my medical decision making (see chart for  details).  Clinical Course as of Dec 18 1247  Sun Dec 18, 2019  2683 Patient with atypical right-sided chest, neck and shoulder pain.  I favor a musculoskeletal issue versus cervical radicular pain.  However, given her risk factors we will also perform a cardiac work-up today.   [KM]  1045 Workup reassuring. I reassessed the patient and she is improved with medications given and is moving her arm more with less of her symptoms. She did request an additional medication for more pain relief. I will give a dose of norco, continue to monitor and obtain delta trop   [KM]  1248 Delta trop negative and patient remains improving. Ok for d/c home with treatment for musculoskeletal/ radicular pain. Advised to f/u with PMD and advised on return precautions   [KM]    Clinical Course User Index [KM] Jeral Pinch   MDM Rules/Calculators/A&P                      Based on review of vitals, medical screening exam, lab work and/or imaging, there does not appear to be an acute, emergent etiology for the patient's symptoms. Counseled pt on good return precautions and encouraged both PCP and ED follow-up as needed.  Prior to discharge, I also discussed incidental imaging findings with patient in detail and advised appropriate, recommended follow-up in detail.  Clinical Impression: 1. Atypical chest pain   2.  Musculoskeletal pain     Disposition: Discharge  Prior to providing a prescription for a controlled substance, I independently reviewed the patient's recent prescription history on the [place] Controlled Substance Reporting System. The patient had no recent or regular prescriptions and was deemed appropriate for a brief, less than 3 day prescription of narcotic for acute analgesia.  This note was prepared with assistance of Conservation officer, historic buildings. Occasional wrong-word or sound-a-like substitutions may have occurred due to the inherent limitations of voice recognition software.  Final Clinical Impression(s) / ED Diagnoses Final diagnoses:  Atypical chest pain  Musculoskeletal pain    Rx / DC Orders ED Discharge Orders         Ordered    predniSONE (DELTASONE) 10 MG tablet  Daily     12/18/19 1248    cyclobenzaprine (FLEXERIL) 10 MG tablet  2 times daily PRN     12/18/19 1248           Jeral Pinch 12/18/19 1250    Little, Ambrose Finland, MD 12/18/19 1524

## 2021-02-01 IMAGING — DX DG CHEST 1V PORT
1 series · 1 of 1 positions shown · non-contrast
Comparison: Chest radiograph dated 02/19/2019

CLINICAL DATA: Chest pain

EXAM:
PORTABLE CHEST 1 VIEW

[chest ap]
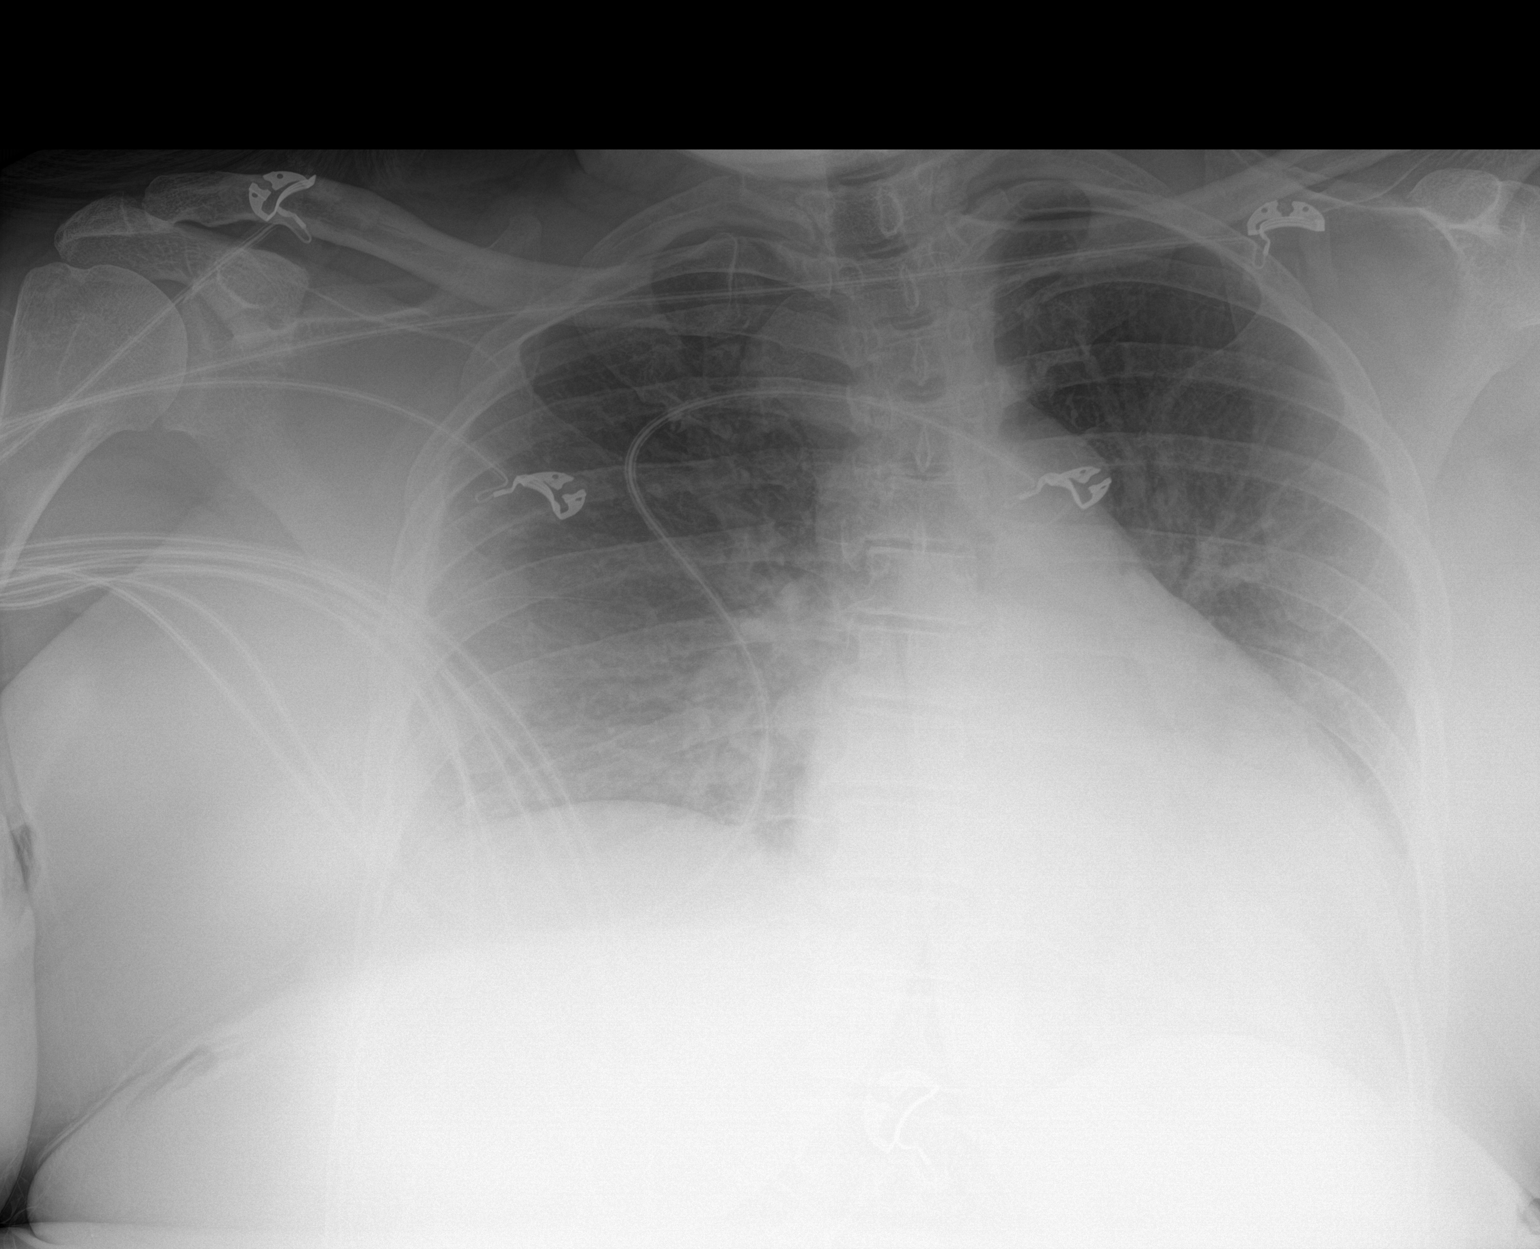

[1 of 1 positions shown; findings below may reference images not displayed]

FINDINGS: The heart size and mediastinal contours are within normal limits
accounting for technique. The lung volumes are low. The left lower
lung is obscured which may reflect atelectasis/airspace disease
versus overlying cardiac silhouette. The right lung is clear. There
is no pleural effusion or pneumothorax. The visualized skeletal
structures are unremarkable.
IMPRESSION: Possible left lower lung atelectasis/airspace disease.

## 2022-08-20 ENCOUNTER — Other Ambulatory Visit: Payer: Self-pay

## 2023-01-02 ENCOUNTER — Other Ambulatory Visit (HOSPITAL_BASED_OUTPATIENT_CLINIC_OR_DEPARTMENT_OTHER): Payer: Self-pay

## 2023-01-02 MED ORDER — WEGOVY 0.25 MG/0.5ML ~~LOC~~ SOAJ
0.2500 mg | SUBCUTANEOUS | 0 refills | Status: AC
  Filled 2023-01-02 – 2023-02-03 (×2): qty 2, 28d supply, fill #0

## 2023-01-06 ENCOUNTER — Other Ambulatory Visit (HOSPITAL_BASED_OUTPATIENT_CLINIC_OR_DEPARTMENT_OTHER): Payer: Self-pay

## 2023-01-07 ENCOUNTER — Other Ambulatory Visit (HOSPITAL_BASED_OUTPATIENT_CLINIC_OR_DEPARTMENT_OTHER): Payer: Self-pay

## 2023-01-08 ENCOUNTER — Other Ambulatory Visit (HOSPITAL_BASED_OUTPATIENT_CLINIC_OR_DEPARTMENT_OTHER): Payer: Self-pay

## 2023-01-09 ENCOUNTER — Other Ambulatory Visit (HOSPITAL_BASED_OUTPATIENT_CLINIC_OR_DEPARTMENT_OTHER): Payer: Self-pay

## 2023-01-28 ENCOUNTER — Other Ambulatory Visit (HOSPITAL_BASED_OUTPATIENT_CLINIC_OR_DEPARTMENT_OTHER): Payer: Self-pay

## 2023-01-28 MED ORDER — WEGOVY 0.25 MG/0.5ML ~~LOC~~ SOAJ
0.2500 mg | SUBCUTANEOUS | 0 refills | Status: AC
  Filled 2023-01-28: qty 2, 28d supply, fill #0

## 2023-01-28 MED ORDER — ERGOCALCIFEROL 1.25 MG (50000 UT) PO CAPS
50000.0000 [IU] | ORAL_CAPSULE | ORAL | 5 refills | Status: AC
  Filled 2023-01-28: qty 4, 28d supply, fill #0
  Filled 2023-02-26: qty 4, 28d supply, fill #1

## 2023-02-03 ENCOUNTER — Other Ambulatory Visit (HOSPITAL_BASED_OUTPATIENT_CLINIC_OR_DEPARTMENT_OTHER): Payer: Self-pay

## 2023-02-26 ENCOUNTER — Other Ambulatory Visit (HOSPITAL_BASED_OUTPATIENT_CLINIC_OR_DEPARTMENT_OTHER): Payer: Self-pay

## 2023-02-26 MED ORDER — ERGOCALCIFEROL 1.25 MG (50000 UT) PO CAPS
50000.0000 [IU] | ORAL_CAPSULE | ORAL | 5 refills | Status: AC
  Filled 2023-02-26: qty 4, 28d supply, fill #0

## 2023-02-26 MED ORDER — WEGOVY 0.5 MG/0.5ML ~~LOC~~ SOAJ
0.5000 mg | SUBCUTANEOUS | 0 refills | Status: AC
  Filled 2023-02-26: qty 2, 28d supply, fill #0

## 2023-03-27 ENCOUNTER — Other Ambulatory Visit (HOSPITAL_BASED_OUTPATIENT_CLINIC_OR_DEPARTMENT_OTHER): Payer: Self-pay

## 2023-03-27 MED ORDER — VITAMIN D (ERGOCALCIFEROL) 1.25 MG (50000 UNIT) PO CAPS
50000.0000 [IU] | ORAL_CAPSULE | ORAL | 5 refills | Status: AC
  Filled 2023-03-27: qty 4, 28d supply, fill #0

## 2023-03-27 MED ORDER — WEGOVY 1 MG/0.5ML ~~LOC~~ SOAJ
1.0000 mg | SUBCUTANEOUS | 0 refills | Status: DC
  Filled 2023-03-27: qty 2, 28d supply, fill #0

## 2023-03-30 ENCOUNTER — Other Ambulatory Visit (HOSPITAL_BASED_OUTPATIENT_CLINIC_OR_DEPARTMENT_OTHER): Payer: Self-pay

## 2023-03-30 MED ORDER — WEGOVY 1 MG/0.5ML ~~LOC~~ SOAJ
1.0000 mg | SUBCUTANEOUS | 0 refills | Status: AC
  Filled 2023-03-30: qty 2, 28d supply, fill #0

## 2023-04-24 ENCOUNTER — Other Ambulatory Visit (HOSPITAL_BASED_OUTPATIENT_CLINIC_OR_DEPARTMENT_OTHER): Payer: Self-pay

## 2023-04-24 MED ORDER — ERGOCALCIFEROL 1.25 MG (50000 UT) PO CAPS
50000.0000 [IU] | ORAL_CAPSULE | ORAL | 5 refills | Status: AC
  Filled 2023-04-24: qty 4, 28d supply, fill #0
  Filled 2024-03-27: qty 4, 28d supply, fill #1

## 2023-04-24 MED ORDER — WEGOVY 1.7 MG/0.75ML ~~LOC~~ SOAJ
1.7000 mg | SUBCUTANEOUS | 0 refills | Status: DC
  Filled 2023-04-24: qty 3, 28d supply, fill #0

## 2023-05-21 ENCOUNTER — Other Ambulatory Visit (HOSPITAL_BASED_OUTPATIENT_CLINIC_OR_DEPARTMENT_OTHER): Payer: Self-pay

## 2023-05-21 MED ORDER — WEGOVY 2.4 MG/0.75ML ~~LOC~~ SOAJ
2.4000 mg | SUBCUTANEOUS | 0 refills | Status: DC
  Filled 2023-05-21: qty 3, 28d supply, fill #0

## 2023-06-18 ENCOUNTER — Other Ambulatory Visit (HOSPITAL_BASED_OUTPATIENT_CLINIC_OR_DEPARTMENT_OTHER): Payer: Self-pay

## 2023-06-18 MED ORDER — WEGOVY 1.7 MG/0.75ML ~~LOC~~ SOAJ
1.7000 mg | SUBCUTANEOUS | 0 refills | Status: AC
  Filled 2023-06-18: qty 3, 28d supply, fill #0

## 2023-06-18 MED ORDER — VITAMIN D (ERGOCALCIFEROL) 1.25 MG (50000 UNIT) PO CAPS
50000.0000 [IU] | ORAL_CAPSULE | ORAL | 5 refills | Status: AC
  Filled 2023-06-18: qty 4, 28d supply, fill #0
  Filled 2024-03-27: qty 4, 28d supply, fill #1

## 2023-07-16 ENCOUNTER — Other Ambulatory Visit (HOSPITAL_BASED_OUTPATIENT_CLINIC_OR_DEPARTMENT_OTHER): Payer: Self-pay

## 2023-07-16 MED ORDER — WEGOVY 2.4 MG/0.75ML ~~LOC~~ SOAJ
2.4000 mg | SUBCUTANEOUS | 0 refills | Status: DC
  Filled 2023-07-16: qty 3, 28d supply, fill #0

## 2023-07-29 ENCOUNTER — Other Ambulatory Visit (HOSPITAL_COMMUNITY): Payer: Self-pay

## 2023-08-18 ENCOUNTER — Other Ambulatory Visit (HOSPITAL_BASED_OUTPATIENT_CLINIC_OR_DEPARTMENT_OTHER): Payer: Self-pay

## 2023-08-18 MED ORDER — WEGOVY 2.4 MG/0.75ML ~~LOC~~ SOAJ
2.4000 mg | SUBCUTANEOUS | 0 refills | Status: DC
  Filled 2023-08-18: qty 3, 28d supply, fill #0

## 2023-08-21 ENCOUNTER — Other Ambulatory Visit (HOSPITAL_BASED_OUTPATIENT_CLINIC_OR_DEPARTMENT_OTHER): Payer: Self-pay

## 2023-09-22 ENCOUNTER — Other Ambulatory Visit (HOSPITAL_BASED_OUTPATIENT_CLINIC_OR_DEPARTMENT_OTHER): Payer: Self-pay

## 2023-09-22 MED ORDER — WEGOVY 2.4 MG/0.75ML ~~LOC~~ SOAJ
2.4000 mg | SUBCUTANEOUS | 0 refills | Status: DC
  Filled 2023-09-22: qty 3, 28d supply, fill #0

## 2023-09-22 MED ORDER — ERGOCALCIFEROL 1.25 MG (50000 UT) PO CAPS
50000.0000 [IU] | ORAL_CAPSULE | ORAL | 0 refills | Status: AC
Start: 2023-09-22 — End: ?
  Filled 2023-09-22: qty 4, 28d supply, fill #0

## 2023-10-23 ENCOUNTER — Other Ambulatory Visit (HOSPITAL_BASED_OUTPATIENT_CLINIC_OR_DEPARTMENT_OTHER): Payer: Self-pay

## 2023-10-23 MED ORDER — WEGOVY 2.4 MG/0.75ML ~~LOC~~ SOAJ
2.4000 mg | SUBCUTANEOUS | 0 refills | Status: DC
  Filled 2023-10-23: qty 3, 28d supply, fill #0

## 2023-11-17 ENCOUNTER — Other Ambulatory Visit (HOSPITAL_BASED_OUTPATIENT_CLINIC_OR_DEPARTMENT_OTHER): Payer: Self-pay

## 2023-11-17 MED ORDER — WEGOVY 2.4 MG/0.75ML ~~LOC~~ SOAJ
2.4000 mg | SUBCUTANEOUS | 0 refills | Status: DC
  Filled 2023-11-17: qty 3, 28d supply, fill #0

## 2023-12-17 ENCOUNTER — Other Ambulatory Visit (HOSPITAL_BASED_OUTPATIENT_CLINIC_OR_DEPARTMENT_OTHER): Payer: Self-pay

## 2023-12-17 MED ORDER — RYBELSUS 3 MG PO TABS
3.0000 mg | ORAL_TABLET | Freq: Every day | ORAL | 0 refills | Status: AC
Start: 2023-12-17 — End: ?
  Filled 2023-12-17 – 2024-03-27 (×2): qty 30, 30d supply, fill #0

## 2023-12-17 MED ORDER — WEGOVY 2.4 MG/0.75ML ~~LOC~~ SOAJ
2.4000 mg | SUBCUTANEOUS | 0 refills | Status: DC
  Filled 2023-12-17: qty 3, 28d supply, fill #0

## 2023-12-17 MED ORDER — ERGOCALCIFEROL 1.25 MG (50000 UT) PO CAPS
50000.0000 [IU] | ORAL_CAPSULE | ORAL | 0 refills | Status: AC
Start: 2023-12-17 — End: ?
  Filled 2023-12-17: qty 4, 28d supply, fill #0

## 2023-12-18 ENCOUNTER — Other Ambulatory Visit (HOSPITAL_BASED_OUTPATIENT_CLINIC_OR_DEPARTMENT_OTHER): Payer: Self-pay

## 2023-12-21 ENCOUNTER — Other Ambulatory Visit (HOSPITAL_BASED_OUTPATIENT_CLINIC_OR_DEPARTMENT_OTHER): Payer: Self-pay

## 2023-12-22 ENCOUNTER — Other Ambulatory Visit (HOSPITAL_BASED_OUTPATIENT_CLINIC_OR_DEPARTMENT_OTHER): Payer: Self-pay

## 2023-12-23 ENCOUNTER — Other Ambulatory Visit (HOSPITAL_BASED_OUTPATIENT_CLINIC_OR_DEPARTMENT_OTHER): Payer: Self-pay

## 2023-12-24 ENCOUNTER — Other Ambulatory Visit (HOSPITAL_BASED_OUTPATIENT_CLINIC_OR_DEPARTMENT_OTHER): Payer: Self-pay

## 2023-12-25 ENCOUNTER — Other Ambulatory Visit (HOSPITAL_BASED_OUTPATIENT_CLINIC_OR_DEPARTMENT_OTHER): Payer: Self-pay

## 2023-12-28 ENCOUNTER — Other Ambulatory Visit (HOSPITAL_BASED_OUTPATIENT_CLINIC_OR_DEPARTMENT_OTHER): Payer: Self-pay

## 2023-12-29 ENCOUNTER — Other Ambulatory Visit (HOSPITAL_BASED_OUTPATIENT_CLINIC_OR_DEPARTMENT_OTHER): Payer: Self-pay

## 2023-12-30 ENCOUNTER — Other Ambulatory Visit (HOSPITAL_BASED_OUTPATIENT_CLINIC_OR_DEPARTMENT_OTHER): Payer: Self-pay

## 2023-12-31 ENCOUNTER — Other Ambulatory Visit (HOSPITAL_BASED_OUTPATIENT_CLINIC_OR_DEPARTMENT_OTHER): Payer: Self-pay

## 2024-01-01 ENCOUNTER — Other Ambulatory Visit (HOSPITAL_BASED_OUTPATIENT_CLINIC_OR_DEPARTMENT_OTHER): Payer: Self-pay

## 2024-01-04 ENCOUNTER — Other Ambulatory Visit (HOSPITAL_BASED_OUTPATIENT_CLINIC_OR_DEPARTMENT_OTHER): Payer: Self-pay

## 2024-01-05 ENCOUNTER — Other Ambulatory Visit (HOSPITAL_BASED_OUTPATIENT_CLINIC_OR_DEPARTMENT_OTHER): Payer: Self-pay

## 2024-01-06 ENCOUNTER — Other Ambulatory Visit (HOSPITAL_BASED_OUTPATIENT_CLINIC_OR_DEPARTMENT_OTHER): Payer: Self-pay

## 2024-01-07 ENCOUNTER — Other Ambulatory Visit (HOSPITAL_BASED_OUTPATIENT_CLINIC_OR_DEPARTMENT_OTHER): Payer: Self-pay

## 2024-01-08 ENCOUNTER — Other Ambulatory Visit (HOSPITAL_BASED_OUTPATIENT_CLINIC_OR_DEPARTMENT_OTHER): Payer: Self-pay

## 2024-01-11 ENCOUNTER — Other Ambulatory Visit (HOSPITAL_BASED_OUTPATIENT_CLINIC_OR_DEPARTMENT_OTHER): Payer: Self-pay

## 2024-01-12 ENCOUNTER — Other Ambulatory Visit (HOSPITAL_BASED_OUTPATIENT_CLINIC_OR_DEPARTMENT_OTHER): Payer: Self-pay

## 2024-01-14 ENCOUNTER — Other Ambulatory Visit (HOSPITAL_BASED_OUTPATIENT_CLINIC_OR_DEPARTMENT_OTHER): Payer: Self-pay

## 2024-01-15 ENCOUNTER — Other Ambulatory Visit (HOSPITAL_BASED_OUTPATIENT_CLINIC_OR_DEPARTMENT_OTHER): Payer: Self-pay

## 2024-01-18 ENCOUNTER — Other Ambulatory Visit (HOSPITAL_BASED_OUTPATIENT_CLINIC_OR_DEPARTMENT_OTHER): Payer: Self-pay

## 2024-01-19 ENCOUNTER — Other Ambulatory Visit (HOSPITAL_BASED_OUTPATIENT_CLINIC_OR_DEPARTMENT_OTHER): Payer: Self-pay

## 2024-01-20 ENCOUNTER — Other Ambulatory Visit (HOSPITAL_BASED_OUTPATIENT_CLINIC_OR_DEPARTMENT_OTHER): Payer: Self-pay

## 2024-01-21 ENCOUNTER — Other Ambulatory Visit (HOSPITAL_BASED_OUTPATIENT_CLINIC_OR_DEPARTMENT_OTHER): Payer: Self-pay

## 2024-01-25 ENCOUNTER — Other Ambulatory Visit (HOSPITAL_BASED_OUTPATIENT_CLINIC_OR_DEPARTMENT_OTHER): Payer: Self-pay

## 2024-01-26 ENCOUNTER — Other Ambulatory Visit (HOSPITAL_BASED_OUTPATIENT_CLINIC_OR_DEPARTMENT_OTHER): Payer: Self-pay

## 2024-01-27 ENCOUNTER — Other Ambulatory Visit (HOSPITAL_BASED_OUTPATIENT_CLINIC_OR_DEPARTMENT_OTHER): Payer: Self-pay

## 2024-02-01 ENCOUNTER — Other Ambulatory Visit (HOSPITAL_BASED_OUTPATIENT_CLINIC_OR_DEPARTMENT_OTHER): Payer: Self-pay

## 2024-02-01 MED ORDER — ERGOCALCIFEROL 1.25 MG (50000 UT) PO CAPS
50000.0000 [IU] | ORAL_CAPSULE | ORAL | 0 refills | Status: AC
Start: 2024-02-01 — End: ?
  Filled 2024-02-01: qty 4, 28d supply, fill #0

## 2024-02-02 ENCOUNTER — Other Ambulatory Visit (HOSPITAL_BASED_OUTPATIENT_CLINIC_OR_DEPARTMENT_OTHER): Payer: Self-pay

## 2024-02-03 ENCOUNTER — Other Ambulatory Visit (HOSPITAL_BASED_OUTPATIENT_CLINIC_OR_DEPARTMENT_OTHER): Payer: Self-pay

## 2024-02-04 ENCOUNTER — Other Ambulatory Visit (HOSPITAL_BASED_OUTPATIENT_CLINIC_OR_DEPARTMENT_OTHER): Payer: Self-pay

## 2024-02-05 ENCOUNTER — Other Ambulatory Visit (HOSPITAL_BASED_OUTPATIENT_CLINIC_OR_DEPARTMENT_OTHER): Payer: Self-pay

## 2024-02-08 ENCOUNTER — Other Ambulatory Visit (HOSPITAL_BASED_OUTPATIENT_CLINIC_OR_DEPARTMENT_OTHER): Payer: Self-pay

## 2024-02-08 ENCOUNTER — Emergency Department (HOSPITAL_BASED_OUTPATIENT_CLINIC_OR_DEPARTMENT_OTHER)

## 2024-02-08 ENCOUNTER — Emergency Department (HOSPITAL_BASED_OUTPATIENT_CLINIC_OR_DEPARTMENT_OTHER)
Admission: EM | Admit: 2024-02-08 | Discharge: 2024-02-08 | Disposition: A | Discharge status: Home / Self Care | Attending: Emergency Medicine | Admitting: Emergency Medicine

## 2024-02-08 ENCOUNTER — Encounter (HOSPITAL_BASED_OUTPATIENT_CLINIC_OR_DEPARTMENT_OTHER): Payer: Self-pay | Admitting: Emergency Medicine

## 2024-02-08 ENCOUNTER — Other Ambulatory Visit: Payer: Self-pay

## 2024-02-08 DIAGNOSIS — M5441 Lumbago with sciatica, right side: Secondary | ICD-10-CM | POA: Insufficient documentation

## 2024-02-08 DIAGNOSIS — I10 Essential (primary) hypertension: Secondary | ICD-10-CM | POA: Diagnosis not present

## 2024-02-08 DIAGNOSIS — E119 Type 2 diabetes mellitus without complications: Secondary | ICD-10-CM | POA: Diagnosis not present

## 2024-02-08 DIAGNOSIS — M545 Low back pain, unspecified: Secondary | ICD-10-CM | POA: Diagnosis present

## 2024-02-08 MED ORDER — KETOROLAC TROMETHAMINE 15 MG/ML IJ SOLN
15.0000 mg | Freq: Once | INTRAMUSCULAR | Status: AC
Start: 2024-02-08 — End: 2024-02-08
  Administered 2024-02-08: 15 mg via INTRAMUSCULAR
  Filled 2024-02-08: qty 1

## 2024-02-08 MED ORDER — PREDNISONE 20 MG PO TABS: 40.0000 mg | ORAL_TABLET | Freq: Every day | ORAL | 0 refills | Status: AC

## 2024-02-08 MED ORDER — LIDOCAINE 5 % EX PTCH
2.0000 | MEDICATED_PATCH | CUTANEOUS | Status: DC
  Administered 2024-02-08: 2 via TRANSDERMAL
  Filled 2024-02-08: qty 2

## 2024-02-08 MED ORDER — METHOCARBAMOL 500 MG PO TABS: 500.0000 mg | ORAL_TABLET | Freq: Two times a day (BID) | ORAL | 0 refills | Status: AC

## 2024-02-08 MED ORDER — LIDOCAINE 5 % EX PTCH: 1.0000 | MEDICATED_PATCH | CUTANEOUS | 0 refills | Status: AC

## 2024-02-08 MED ORDER — HYDROCODONE-ACETAMINOPHEN 5-325 MG PO TABS
1.0000 | ORAL_TABLET | Freq: Once | ORAL | Status: AC
  Administered 2024-02-08: 1 via ORAL
  Filled 2024-02-08: qty 1

## 2024-02-08 NOTE — ED Provider Notes (Signed)
 [facility] Provider Note   CSN: [id number] Arrival date & time: 02/08/24  1549     History  Chief Complaint  Patient presents with   Back Pain    Sherry Hines is a 58 y.o. female.  With past medical history of hypertension, diabetes presenting to emergency room with complaint of bilateral low back pain that is radiating down right thigh.  Patient reports she thought she slept on her back wrong.  Denies any injury trauma or fall, denies any heavy lifting or change in activity.  She denies any saddle anesthesia or numbness or tingling in her groin.  Denies loss of bowel or bladder.  Denies history of IV drug use or fever.  Denies history of cancer.  She is able to ambulate.  Pain worse when touching area or with hip flexion. No swelling in feet and ankles.    Back Pain      Home Medications Prior to Admission medications   Medication Sig Start Date End Date Taking? Authorizing Provider  lidocaine  (LIDODERM ) 5 % Place 1 patch onto the skin daily. Remove & Discard patch within 12 hours or as directed by MD 02/08/24  Yes Murlin Schrieber, Kandace Organ, PA-C  methocarbamol  (ROBAXIN ) 500 MG tablet Take 1 tablet (500 mg total) by mouth 2 (two) times daily. 02/08/24  Yes Kejon Feild, Kandace Organ, PA-C  predniSONE  (DELTASONE ) 20 MG tablet Take 2 tablets (40 mg total) by mouth daily. 02/08/24  Yes Kathya Wilz, Kandace Organ, PA-C  Diclofenac  Sodium CR (VOLTAREN -XR) 100 MG 24 hr tablet Take 1 tablet (100 mg total) by mouth daily. 09/11/15   Palumbo, April, MD  ergocalciferol  (VITAMIN D2) 1.25 MG (50000 UT) capsule Take 1 capsule (50,000 Units total) by mouth once a week. 01/28/23     ergocalciferol  (VITAMIN D2) 1.25 MG (50000 UT) capsule Take 1 capsule (50,000 Units total) by mouth once a week. 02/26/23     ergocalciferol  (VITAMIN D2) 1.25 MG (50000 UT) capsule Take 1 capsule (50,000 Units total) by mouth once a week. 04/24/23     ergocalciferol  (VITAMIN D2) 1.25 MG (50000 UT) capsule  Take 1 capsule (50,000 Units total) by mouth once a week. 09/22/23     ergocalciferol  (VITAMIN D2) 1.25 MG (50000 UT) capsule Take 1 capsule (50,000 Units total) by mouth once a week. 12/17/23     ergocalciferol  (VITAMIN D2) 1.25 MG (50000 UT) capsule Take 1 capsule (50,000 Units total) by mouth once a week. 02/01/24     HYDROcodone -acetaminophen  (NORCO/VICODIN) 5-325 MG per tablet Take 2 tablets by mouth every 4 (four) hours as needed. 02/26/14   Sofia, Leslie K, PA-C  naproxen  (NAPROSYN ) 500 MG tablet Take 1 tablet (500 mg total) by mouth 2 (two) times daily. 02/19/19   Petrucelli, Samantha R, PA-C  Semaglutide  (RYBELSUS ) 3 MG TABS Take 1 tablet (3 mg total) by mouth daily. 12/17/23     Semaglutide -Weight Management (WEGOVY ) 0.25 MG/0.5ML SOAJ Inject 0.25 mg into the skin every 7 (seven) days. 01/02/23     Semaglutide -Weight Management (WEGOVY ) 0.25 MG/0.5ML SOAJ Inject 0.25 mg into the skin once a week. 01/28/23     Semaglutide -Weight Management (WEGOVY ) 0.5 MG/0.5ML SOAJ Inject 0.5 mg into the skin once a week. 02/26/23     Semaglutide -Weight Management (WEGOVY ) 1 MG/0.5ML SOAJ Inject 1 mg into the skin once a week. 03/30/23     Semaglutide -Weight Management (WEGOVY ) 1.7 MG/0.75ML SOAJ Inject 1.7 mg into the skin once a week. 06/18/23  Semaglutide -Weight Management (WEGOVY ) 2.4 MG/0.75ML SOAJ Inject 2.4 mg into the skin once a week. 12/17/23     TRAMADOL HCL PO Take by mouth.    [provider]  Vitamin D , Ergocalciferol , (DRISDOL ) 1.25 MG (50000 UNIT) CAPS capsule Take 1 capsule (50,000 Units total) by mouth once a week. 03/27/23     Vitamin D , Ergocalciferol , (DRISDOL ) 1.25 MG (50000 UNIT) CAPS capsule Take 1 capsule (50,000 Units total) by mouth once a week. 06/18/23         Allergies    Patient has no known allergies.    Review of Systems   Review of Systems  Musculoskeletal:  Positive for back pain.    Physical Exam Updated Vital Signs BP (!) 140/79 (BP Location: Right Arm)   Pulse 71    Temp 98.8 F (37.1 C) (Oral)   Resp 16   Wt 98.4 kg   SpO2 100%   BMI 33.99 kg/m  Physical Exam Vitals and nursing note reviewed.  Constitutional:      General: She is not in acute distress.    Appearance: She is not toxic-appearing.  HENT:     Head: Normocephalic and atraumatic.  Eyes:     General: No scleral icterus.    Conjunctiva/sclera: Conjunctivae normal.  Cardiovascular:     Rate and Rhythm: Normal rate and regular rhythm.     Pulses: Normal pulses.     Heart sounds: Normal heart sounds.  Pulmonary:     Effort: Pulmonary effort is normal. No respiratory distress.     Breath sounds: Normal breath sounds.  Abdominal:     General: Abdomen is flat. Bowel sounds are normal.     Palpations: Abdomen is soft.     Tenderness: There is no abdominal tenderness.  Musculoskeletal:     Right lower leg: No edema.     Left lower leg: No edema.     Comments: Producible tenderness over right and left side low back.  No midline tenderness step-off or deformity.  Sensation intact.  Testing is somewhat limited secondary to pain however patient able to perform straight leg raise.  Has strong dorsiflexion and plantarflexion.  Skin:    General: Skin is warm and dry.     Findings: No lesion.  Neurological:     General: No focal deficit present.     Mental Status: She is alert and oriented to person, place, and time. Mental status is at baseline.     ED Results / Procedures / Treatments   Labs (all labs ordered are listed, but only abnormal results are displayed) Labs Reviewed - No data to display  EKG None  Radiology No results found.  Procedures Procedures    Medications Ordered in ED Medications  lidocaine  (LIDODERM ) 5 % 2 patch (2 patches Transdermal Patch Applied 02/08/24 1832)  HYDROcodone -acetaminophen  (NORCO/VICODIN) 5-325 MG per tablet 1 tablet (1 tablet Oral Given 02/08/24 1830)  ketorolac  (TORADOL ) 15 MG/ML injection 15 mg (15 mg Intramuscular Given 02/08/24 1830)     ED Course/ Medical Decision Making/ A&P                                 Medical Decision Making Amount and/or Complexity of Data Reviewed Radiology: ordered.  Risk Prescription drug management.   Kraig Peru 58 y.o. presented today for back pain. Working Ddx: MSK in nature, fracture, epidural hematoma/abscess, cauda equina syndrome, spinal stenosis, spinal malignancy, discitis, spinal infection,  spondylitises/ spondylosis, conus medullaris, DDD of the back.  R/o DDx: Cauda equina syndrome and additional dx are less likely than current impression due to history of present illness, physical exam, labs/imaging findings. No focal neurological deficits, no loss of bowel or bladder control.  Denies fever, night sweats, weight loss, h/o cancer, IVDU.  PMHX: HTN, DM  Imaging: x-ray of lumbar spine, pending. Patient reports postmenopausal.  Problem List / ED Course / Critical interventions / Medication management  Orting to emergency room with complaint of low back pain.  She has history of similar but has not been this severe.  She has right sided radicular symptoms.  Denies injury trauma or fall.  She has no midline tenderness.  She has no red flag symptoms associate with back pain.  She is ambulatory with steady gait however reproduces symptoms.  Will x-ray to rule out acute pathology.  Do not feel that emergent MRI is indicated at this time.  Will give orthopedic follow-up.  Will discharge with symptomatic management. I ordered medication including Toradol , Norco, Lidoderm .  Reevaluation of the patient after these medicines showed that the patient improved Patients vitals assessed. Upon arrival patient is  hemodynamically stable.  I have reviewed the patients home medicines and have made adjustments as needed    Plan: Sign off to Erin PA-C at shift changing pending imaging and reassessment.  Symptom management and follow-up provided.  Return precautions already given.  Anticipate  patient will be stable for discharge.         Final Clinical Impression(s) / ED Diagnoses Final diagnoses:  Acute bilateral low back pain with right-sided sciatica    Rx / DC Orders ED Discharge Orders          Ordered    predniSONE  (DELTASONE ) 20 MG tablet  Daily        02/08/24 1824    lidocaine  (LIDODERM ) 5 %  Every 24 hours        02/08/24 1824    methocarbamol  (ROBAXIN ) 500 MG tablet  2 times daily        02/08/24 1824              Treyshaun Keatts, Kandace Organ, PA-C 02/08/24 1905    Zackowski, Scott, MD 02/12/24 [phone]

## 2024-02-08 NOTE — ED Triage Notes (Signed)
 Right lower back pain going down to right thigh . No Hx sciatica , denies urinary symptoms , no fall or new injury .

## 2024-02-08 NOTE — Discharge Instructions (Addendum)
 Please take steroid as prescribed.  You can also take Tylenol  1000 mg every 8 hours.  Use ice knee over area of pain.  I sent Robaxin  to take as needed for muscle spasm.  Once you finish steroids you can also take ibuprofen daily.  Return with new or worsening symptoms.

## 2024-02-08 NOTE — ED Provider Notes (Signed)
 I received this patient at shift change from [facility], see her note for further detail.  Lumbar x-ray pending. Physical Exam  BP 136/77   Pulse (!) 56   Temp 98.5 F (36.9 C) (Oral)   Resp 18   Wt 98.4 kg   SpO2 100%   BMI 33.99 kg/m   Physical Exam  Procedures  Procedures  ED Course / MDM    Medical Decision Making Lumbar x-ray results as follows: Degenerative changes as outlined above. No acute fracture or listhesis.  PA Alveda Jing discussed plan in depth with patient, recommended orthopedic follow-up, contact information provided.  Patient going home with prescriptions for robaxin , prednisone , lidocaine  patch as directed by PA Jamie Barrett.  I am in agreement with this plan, patient voiced understanding and is in agreement as well.  She is appropriate for discharge at this time  Amount and/or Complexity of Data Reviewed Radiology: ordered.  Risk Prescription drug management.          Adolm Ahumada 02/08/24 2034    Nicklas Barns, MD 02/12/24 [phone]

## 2024-02-09 ENCOUNTER — Other Ambulatory Visit (HOSPITAL_BASED_OUTPATIENT_CLINIC_OR_DEPARTMENT_OTHER): Payer: Self-pay

## 2024-02-10 ENCOUNTER — Other Ambulatory Visit (HOSPITAL_COMMUNITY): Payer: Self-pay

## 2024-02-10 ENCOUNTER — Other Ambulatory Visit (HOSPITAL_BASED_OUTPATIENT_CLINIC_OR_DEPARTMENT_OTHER): Payer: Self-pay

## 2024-02-11 ENCOUNTER — Other Ambulatory Visit (HOSPITAL_BASED_OUTPATIENT_CLINIC_OR_DEPARTMENT_OTHER): Payer: Self-pay

## 2024-02-12 ENCOUNTER — Other Ambulatory Visit (HOSPITAL_COMMUNITY): Payer: Self-pay

## 2024-02-12 ENCOUNTER — Other Ambulatory Visit (HOSPITAL_BASED_OUTPATIENT_CLINIC_OR_DEPARTMENT_OTHER): Payer: Self-pay

## 2024-02-12 MED ORDER — WEGOVY 2.4 MG/0.75ML ~~LOC~~ SOAJ
2.4000 mg | SUBCUTANEOUS | 0 refills | Status: AC
  Filled 2024-02-12 – 2024-03-02 (×2): qty 3, 28d supply, fill #0

## 2024-02-16 ENCOUNTER — Other Ambulatory Visit (HOSPITAL_BASED_OUTPATIENT_CLINIC_OR_DEPARTMENT_OTHER): Payer: Self-pay

## 2024-02-17 ENCOUNTER — Other Ambulatory Visit (HOSPITAL_BASED_OUTPATIENT_CLINIC_OR_DEPARTMENT_OTHER): Payer: Self-pay

## 2024-02-17 ENCOUNTER — Other Ambulatory Visit (HOSPITAL_COMMUNITY): Payer: Self-pay

## 2024-02-18 ENCOUNTER — Other Ambulatory Visit (HOSPITAL_BASED_OUTPATIENT_CLINIC_OR_DEPARTMENT_OTHER): Payer: Self-pay

## 2024-02-19 ENCOUNTER — Other Ambulatory Visit (HOSPITAL_BASED_OUTPATIENT_CLINIC_OR_DEPARTMENT_OTHER): Payer: Self-pay

## 2024-02-22 ENCOUNTER — Other Ambulatory Visit (HOSPITAL_BASED_OUTPATIENT_CLINIC_OR_DEPARTMENT_OTHER): Payer: Self-pay

## 2024-02-23 ENCOUNTER — Other Ambulatory Visit (HOSPITAL_BASED_OUTPATIENT_CLINIC_OR_DEPARTMENT_OTHER): Payer: Self-pay

## 2024-02-24 ENCOUNTER — Other Ambulatory Visit (HOSPITAL_BASED_OUTPATIENT_CLINIC_OR_DEPARTMENT_OTHER): Payer: Self-pay

## 2024-02-24 ENCOUNTER — Other Ambulatory Visit (HOSPITAL_COMMUNITY): Payer: Self-pay

## 2024-02-25 ENCOUNTER — Other Ambulatory Visit (HOSPITAL_BASED_OUTPATIENT_CLINIC_OR_DEPARTMENT_OTHER): Payer: Self-pay

## 2024-02-26 ENCOUNTER — Other Ambulatory Visit (HOSPITAL_BASED_OUTPATIENT_CLINIC_OR_DEPARTMENT_OTHER): Payer: Self-pay

## 2024-02-29 ENCOUNTER — Other Ambulatory Visit (HOSPITAL_BASED_OUTPATIENT_CLINIC_OR_DEPARTMENT_OTHER): Payer: Self-pay

## 2024-03-01 ENCOUNTER — Other Ambulatory Visit (HOSPITAL_BASED_OUTPATIENT_CLINIC_OR_DEPARTMENT_OTHER): Payer: Self-pay

## 2024-03-01 MED ORDER — WEGOVY 2.4 MG/0.75ML ~~LOC~~ SOAJ
2.4000 mg | SUBCUTANEOUS | 0 refills | Status: AC
  Filled 2024-03-01 – 2024-03-27 (×2): qty 3, 28d supply, fill #0

## 2024-03-01 MED ORDER — VITAMIN D (ERGOCALCIFEROL) 1.25 MG (50000 UNIT) PO CAPS
50000.0000 [IU] | ORAL_CAPSULE | ORAL | 0 refills | Status: AC
  Filled 2024-03-01: qty 4, 28d supply, fill #0
  Filled 2024-03-27: qty 4, 28d supply, fill #1

## 2024-03-02 ENCOUNTER — Other Ambulatory Visit (HOSPITAL_BASED_OUTPATIENT_CLINIC_OR_DEPARTMENT_OTHER): Payer: Self-pay

## 2024-03-03 ENCOUNTER — Other Ambulatory Visit (HOSPITAL_BASED_OUTPATIENT_CLINIC_OR_DEPARTMENT_OTHER): Payer: Self-pay

## 2024-03-28 ENCOUNTER — Other Ambulatory Visit (HOSPITAL_BASED_OUTPATIENT_CLINIC_OR_DEPARTMENT_OTHER): Payer: Self-pay

## 2024-03-28 ENCOUNTER — Other Ambulatory Visit: Payer: Self-pay

## 2024-03-29 ENCOUNTER — Other Ambulatory Visit (HOSPITAL_BASED_OUTPATIENT_CLINIC_OR_DEPARTMENT_OTHER): Payer: Self-pay

## 2024-03-30 ENCOUNTER — Other Ambulatory Visit (HOSPITAL_BASED_OUTPATIENT_CLINIC_OR_DEPARTMENT_OTHER): Payer: Self-pay

## 2024-03-31 ENCOUNTER — Other Ambulatory Visit (HOSPITAL_BASED_OUTPATIENT_CLINIC_OR_DEPARTMENT_OTHER): Payer: Self-pay

## 2024-04-01 ENCOUNTER — Other Ambulatory Visit (HOSPITAL_BASED_OUTPATIENT_CLINIC_OR_DEPARTMENT_OTHER): Payer: Self-pay

## 2024-04-01 MED ORDER — WEGOVY 2.4 MG/0.75ML ~~LOC~~ SOAJ
2.4000 mg | SUBCUTANEOUS | 3 refills | Status: DC
  Filled 2024-04-01 – 2024-04-28 (×2): qty 3, 28d supply, fill #0
  Filled 2024-05-27: qty 3, 28d supply, fill #1
  Filled 2024-06-23: qty 3, 28d supply, fill #2
  Filled 2024-07-19: qty 3, 28d supply, fill #3

## 2024-04-01 MED ORDER — ERGOCALCIFEROL 1.25 MG (50000 UT) PO CAPS
50000.0000 [IU] | ORAL_CAPSULE | ORAL | 1 refills | Status: AC
  Filled 2024-04-01: qty 4, 28d supply, fill #0

## 2024-04-04 ENCOUNTER — Other Ambulatory Visit (HOSPITAL_BASED_OUTPATIENT_CLINIC_OR_DEPARTMENT_OTHER): Payer: Self-pay

## 2024-04-05 ENCOUNTER — Other Ambulatory Visit (HOSPITAL_BASED_OUTPATIENT_CLINIC_OR_DEPARTMENT_OTHER): Payer: Self-pay

## 2024-04-07 ENCOUNTER — Other Ambulatory Visit (HOSPITAL_BASED_OUTPATIENT_CLINIC_OR_DEPARTMENT_OTHER): Payer: Self-pay

## 2024-04-08 ENCOUNTER — Other Ambulatory Visit (HOSPITAL_BASED_OUTPATIENT_CLINIC_OR_DEPARTMENT_OTHER): Payer: Self-pay

## 2024-04-09 ENCOUNTER — Other Ambulatory Visit (HOSPITAL_BASED_OUTPATIENT_CLINIC_OR_DEPARTMENT_OTHER): Payer: Self-pay

## 2024-04-11 ENCOUNTER — Other Ambulatory Visit (HOSPITAL_BASED_OUTPATIENT_CLINIC_OR_DEPARTMENT_OTHER): Payer: Self-pay

## 2024-04-12 ENCOUNTER — Other Ambulatory Visit (HOSPITAL_BASED_OUTPATIENT_CLINIC_OR_DEPARTMENT_OTHER): Payer: Self-pay

## 2024-04-13 ENCOUNTER — Other Ambulatory Visit (HOSPITAL_BASED_OUTPATIENT_CLINIC_OR_DEPARTMENT_OTHER): Payer: Self-pay

## 2024-04-14 ENCOUNTER — Other Ambulatory Visit (HOSPITAL_BASED_OUTPATIENT_CLINIC_OR_DEPARTMENT_OTHER): Payer: Self-pay

## 2024-04-15 ENCOUNTER — Other Ambulatory Visit (HOSPITAL_BASED_OUTPATIENT_CLINIC_OR_DEPARTMENT_OTHER): Payer: Self-pay

## 2024-04-18 ENCOUNTER — Other Ambulatory Visit (HOSPITAL_BASED_OUTPATIENT_CLINIC_OR_DEPARTMENT_OTHER): Payer: Self-pay

## 2024-04-19 ENCOUNTER — Other Ambulatory Visit (HOSPITAL_BASED_OUTPATIENT_CLINIC_OR_DEPARTMENT_OTHER): Payer: Self-pay

## 2024-04-21 ENCOUNTER — Other Ambulatory Visit (HOSPITAL_BASED_OUTPATIENT_CLINIC_OR_DEPARTMENT_OTHER): Payer: Self-pay

## 2024-04-22 ENCOUNTER — Other Ambulatory Visit (HOSPITAL_BASED_OUTPATIENT_CLINIC_OR_DEPARTMENT_OTHER): Payer: Self-pay

## 2024-04-25 ENCOUNTER — Other Ambulatory Visit (HOSPITAL_BASED_OUTPATIENT_CLINIC_OR_DEPARTMENT_OTHER): Payer: Self-pay

## 2024-04-26 ENCOUNTER — Other Ambulatory Visit (HOSPITAL_BASED_OUTPATIENT_CLINIC_OR_DEPARTMENT_OTHER): Payer: Self-pay

## 2024-04-27 ENCOUNTER — Other Ambulatory Visit (HOSPITAL_BASED_OUTPATIENT_CLINIC_OR_DEPARTMENT_OTHER): Payer: Self-pay

## 2024-04-28 ENCOUNTER — Other Ambulatory Visit (HOSPITAL_BASED_OUTPATIENT_CLINIC_OR_DEPARTMENT_OTHER): Payer: Self-pay

## 2024-04-29 ENCOUNTER — Other Ambulatory Visit (HOSPITAL_BASED_OUTPATIENT_CLINIC_OR_DEPARTMENT_OTHER): Payer: Self-pay

## 2024-04-29 MED ORDER — PROCTOFOAM HC 1-1 % EX FOAM
1.0000 | CUTANEOUS | 0 refills | Status: AC | PRN
  Filled 2024-04-29: qty 10, 30d supply, fill #0

## 2024-04-29 MED ORDER — FLEET ENEMA RE ENEM
1.0000 | ENEMA | RECTAL | 0 refills | Status: AC | PRN
  Filled 2024-04-29: qty 133, 1d supply, fill #0

## 2024-05-02 ENCOUNTER — Other Ambulatory Visit (HOSPITAL_BASED_OUTPATIENT_CLINIC_OR_DEPARTMENT_OTHER): Payer: Self-pay

## 2024-05-13 ENCOUNTER — Other Ambulatory Visit (HOSPITAL_BASED_OUTPATIENT_CLINIC_OR_DEPARTMENT_OTHER): Payer: Self-pay

## 2024-05-27 ENCOUNTER — Other Ambulatory Visit (HOSPITAL_BASED_OUTPATIENT_CLINIC_OR_DEPARTMENT_OTHER): Payer: Self-pay

## 2024-06-23 ENCOUNTER — Other Ambulatory Visit (HOSPITAL_BASED_OUTPATIENT_CLINIC_OR_DEPARTMENT_OTHER): Payer: Self-pay

## 2024-06-23 ENCOUNTER — Other Ambulatory Visit (HOSPITAL_COMMUNITY): Payer: Self-pay

## 2024-07-19 ENCOUNTER — Other Ambulatory Visit (HOSPITAL_BASED_OUTPATIENT_CLINIC_OR_DEPARTMENT_OTHER): Payer: Self-pay

## 2024-08-19 ENCOUNTER — Other Ambulatory Visit (HOSPITAL_BASED_OUTPATIENT_CLINIC_OR_DEPARTMENT_OTHER): Payer: Self-pay

## 2024-08-22 ENCOUNTER — Other Ambulatory Visit (HOSPITAL_BASED_OUTPATIENT_CLINIC_OR_DEPARTMENT_OTHER): Payer: Self-pay

## 2024-08-22 MED ORDER — WEGOVY 2.4 MG/0.75ML ~~LOC~~ SOAJ
2.4000 mg | SUBCUTANEOUS | 3 refills | Status: AC
  Filled 2024-08-22: qty 3, 28d supply, fill #0
  Filled 2024-09-16: qty 3, 28d supply, fill #1
  Filled 2024-10-25: qty 3, 28d supply, fill #2

## 2024-09-16 ENCOUNTER — Other Ambulatory Visit (HOSPITAL_BASED_OUTPATIENT_CLINIC_OR_DEPARTMENT_OTHER): Payer: Self-pay

## 2024-10-25 ENCOUNTER — Other Ambulatory Visit (HOSPITAL_BASED_OUTPATIENT_CLINIC_OR_DEPARTMENT_OTHER): Payer: Self-pay

## 2024-10-28 ENCOUNTER — Other Ambulatory Visit (HOSPITAL_BASED_OUTPATIENT_CLINIC_OR_DEPARTMENT_OTHER): Payer: Self-pay
